# Patient Record
Sex: Male | Born: 1965 | Race: White | Hispanic: No | Marital: Married | Smoking: Current every day smoker
Health system: Southern US, Community
[De-identification: ages and names within clinical notes are randomized; demographics above are authoritative.]

## PROBLEM LIST (undated history)

## (undated) DIAGNOSIS — M25519 Pain in unspecified shoulder: Secondary | ICD-10-CM

## (undated) DIAGNOSIS — R0600 Dyspnea, unspecified: Secondary | ICD-10-CM

## (undated) DIAGNOSIS — M1990 Unspecified osteoarthritis, unspecified site: Secondary | ICD-10-CM

## (undated) DIAGNOSIS — J449 Chronic obstructive pulmonary disease, unspecified: Secondary | ICD-10-CM

## (undated) DIAGNOSIS — K219 Gastro-esophageal reflux disease without esophagitis: Secondary | ICD-10-CM

## (undated) DIAGNOSIS — I10 Essential (primary) hypertension: Secondary | ICD-10-CM

## (undated) DIAGNOSIS — M542 Cervicalgia: Secondary | ICD-10-CM

## (undated) HISTORY — DX: Chronic obstructive pulmonary disease, unspecified: J44.9

## (undated) HISTORY — PX: NO PAST SURGERIES: SHX2092

---

## 2017-12-06 ENCOUNTER — Encounter: Payer: Self-pay | Admitting: *Deleted

## 2017-12-25 DIAGNOSIS — F17200 Nicotine dependence, unspecified, uncomplicated: Secondary | ICD-10-CM | POA: Diagnosis not present

## 2017-12-25 DIAGNOSIS — Z6830 Body mass index (BMI) 30.0-30.9, adult: Secondary | ICD-10-CM | POA: Diagnosis not present

## 2017-12-25 DIAGNOSIS — J449 Chronic obstructive pulmonary disease, unspecified: Secondary | ICD-10-CM | POA: Diagnosis not present

## 2017-12-25 DIAGNOSIS — L989 Disorder of the skin and subcutaneous tissue, unspecified: Secondary | ICD-10-CM | POA: Diagnosis not present

## 2018-01-01 DIAGNOSIS — Z0000 Encounter for general adult medical examination without abnormal findings: Secondary | ICD-10-CM | POA: Diagnosis not present

## 2018-01-10 DIAGNOSIS — L821 Other seborrheic keratosis: Secondary | ICD-10-CM | POA: Diagnosis not present

## 2018-01-10 DIAGNOSIS — D0439 Carcinoma in situ of skin of other parts of face: Secondary | ICD-10-CM | POA: Diagnosis not present

## 2018-01-10 DIAGNOSIS — C44329 Squamous cell carcinoma of skin of other parts of face: Secondary | ICD-10-CM | POA: Diagnosis not present

## 2018-01-10 DIAGNOSIS — L570 Actinic keratosis: Secondary | ICD-10-CM | POA: Diagnosis not present

## 2018-01-15 DIAGNOSIS — J449 Chronic obstructive pulmonary disease, unspecified: Secondary | ICD-10-CM | POA: Diagnosis not present

## 2018-01-15 DIAGNOSIS — L989 Disorder of the skin and subcutaneous tissue, unspecified: Secondary | ICD-10-CM | POA: Diagnosis not present

## 2018-01-15 DIAGNOSIS — F17200 Nicotine dependence, unspecified, uncomplicated: Secondary | ICD-10-CM | POA: Diagnosis not present

## 2018-01-15 DIAGNOSIS — Z0000 Encounter for general adult medical examination without abnormal findings: Secondary | ICD-10-CM | POA: Diagnosis not present

## 2018-01-16 ENCOUNTER — Other Ambulatory Visit: Payer: Self-pay | Admitting: Internal Medicine

## 2018-01-16 DIAGNOSIS — F17200 Nicotine dependence, unspecified, uncomplicated: Secondary | ICD-10-CM

## 2018-01-23 ENCOUNTER — Ambulatory Visit (HOSPITAL_COMMUNITY)
Admission: RE | Admit: 2018-01-23 | Discharge: 2018-01-23 | Disposition: A | Discharge status: Home / Self Care | Payer: BLUE CROSS/BLUE SHIELD | Source: Ambulatory Visit | Attending: Internal Medicine | Admitting: Internal Medicine

## 2018-01-23 ENCOUNTER — Other Ambulatory Visit: Payer: Self-pay | Admitting: Internal Medicine

## 2018-01-23 DIAGNOSIS — F17200 Nicotine dependence, unspecified, uncomplicated: Secondary | ICD-10-CM

## 2018-01-23 DIAGNOSIS — Z136 Encounter for screening for cardiovascular disorders: Secondary | ICD-10-CM | POA: Diagnosis not present

## 2018-01-23 DIAGNOSIS — Z87891 Personal history of nicotine dependence: Secondary | ICD-10-CM | POA: Diagnosis not present

## 2018-02-14 DIAGNOSIS — L570 Actinic keratosis: Secondary | ICD-10-CM | POA: Diagnosis not present

## 2018-02-25 ENCOUNTER — Ambulatory Visit: Payer: BLUE CROSS/BLUE SHIELD

## 2018-05-09 DIAGNOSIS — Z6830 Body mass index (BMI) 30.0-30.9, adult: Secondary | ICD-10-CM | POA: Diagnosis not present

## 2018-05-09 DIAGNOSIS — L989 Disorder of the skin and subcutaneous tissue, unspecified: Secondary | ICD-10-CM | POA: Diagnosis not present

## 2018-05-09 DIAGNOSIS — J449 Chronic obstructive pulmonary disease, unspecified: Secondary | ICD-10-CM | POA: Diagnosis not present

## 2018-05-09 DIAGNOSIS — F17200 Nicotine dependence, unspecified, uncomplicated: Secondary | ICD-10-CM | POA: Diagnosis not present

## 2018-05-14 DIAGNOSIS — F17200 Nicotine dependence, unspecified, uncomplicated: Secondary | ICD-10-CM | POA: Diagnosis not present

## 2018-05-14 DIAGNOSIS — J441 Chronic obstructive pulmonary disease with (acute) exacerbation: Secondary | ICD-10-CM | POA: Diagnosis not present

## 2019-09-25 ENCOUNTER — Other Ambulatory Visit: Payer: Self-pay | Admitting: Orthopedic Surgery

## 2019-09-25 DIAGNOSIS — M25511 Pain in right shoulder: Secondary | ICD-10-CM

## 2019-09-25 DIAGNOSIS — M542 Cervicalgia: Secondary | ICD-10-CM

## 2019-10-25 ENCOUNTER — Other Ambulatory Visit: Payer: BLUE CROSS/BLUE SHIELD

## 2019-11-19 ENCOUNTER — Ambulatory Visit
Admission: RE | Admit: 2019-11-19 | Discharge: 2019-11-19 | Disposition: A | Discharge status: Home / Self Care | Payer: 59 | Source: Ambulatory Visit | Attending: Orthopedic Surgery | Admitting: Orthopedic Surgery

## 2019-11-19 ENCOUNTER — Other Ambulatory Visit: Payer: Self-pay

## 2019-11-19 DIAGNOSIS — M25511 Pain in right shoulder: Secondary | ICD-10-CM

## 2019-11-19 DIAGNOSIS — M542 Cervicalgia: Secondary | ICD-10-CM

## 2019-12-18 ENCOUNTER — Other Ambulatory Visit: Payer: 59

## 2019-12-22 ENCOUNTER — Other Ambulatory Visit: Payer: 59

## 2020-05-18 ENCOUNTER — Encounter: Payer: Self-pay | Admitting: Emergency Medicine

## 2020-05-18 ENCOUNTER — Ambulatory Visit (INDEPENDENT_AMBULATORY_CARE_PROVIDER_SITE_OTHER): Payer: 59

## 2020-05-18 ENCOUNTER — Ambulatory Visit
Admission: EM | Admit: 2020-05-18 | Discharge: 2020-05-18 | Disposition: A | Discharge status: Home / Self Care | Payer: 59 | Attending: Emergency Medicine | Admitting: Emergency Medicine

## 2020-05-18 DIAGNOSIS — M25572 Pain in left ankle and joints of left foot: Secondary | ICD-10-CM | POA: Diagnosis not present

## 2020-05-18 DIAGNOSIS — M79672 Pain in left foot: Secondary | ICD-10-CM

## 2020-05-18 HISTORY — DX: Unspecified osteoarthritis, unspecified site: M19.90

## 2020-05-18 HISTORY — DX: Essential (primary) hypertension: I10

## 2020-05-18 HISTORY — DX: Cervicalgia: M54.2

## 2020-05-18 HISTORY — DX: Pain in unspecified shoulder: M25.519

## 2020-05-18 NOTE — ED Triage Notes (Signed)
Pain to top of LT foot and LT ankle since yesterday when he stepped in a hole and twisted his ankle.  Pt is ambulatory and wearing an orthopedic shoe.

## 2020-05-18 NOTE — Discharge Instructions (Addendum)
Take OTC Tylenol/ibuprofen as needed for moderate pain Continue to take the Vicodin as needed for severe pain Follow RICE instruction that is attached Follow-up with PCP Return or go to ED if you develop any new or worsening of symptoms

## 2020-05-18 NOTE — ED Provider Notes (Signed)
[facility]   [id number] 05/18/20 Arrival Time: 0813   Chief Complaint  Patient presents with  . Foot Injury     SUBJECTIVE: History from: patient and family.  Julian Perez is a 54 y.o. male who presented to the urgent care with a complaint of left foot/ankle pain that started yesterday.  Foot of the injury while stepping in a hole and twisting his ankle.  He localizes the pain to the left foot/ankle.  He describes the pain as constant and achy.  He has tried OTC medications without relief.  His symptoms are made worse with ROM.  He denies similar symptoms in the past.  Denies chills, fever, nausea, vomiting, diarrhea.   ROS: As per HPI.  All other pertinent ROS negative.      Past Medical History:  Diagnosis Date  . Arthritis   . COPD (chronic obstructive pulmonary disease) ([place])   . Hypertension   . Neck pain   . Shoulder pain    History reviewed. No pertinent surgical history. Allergies  Allergen Reactions  . Penicillins Other (See Comments)    DIZZINESS    No current facility-administered medications on file prior to encounter.   No current outpatient medications on file prior to encounter.   Social History   Socioeconomic History  . Marital status: Married    Spouse name: Not on file  . Number of children: Not on file  . Years of education: Not on file  . Highest education level: Not on file  Occupational History  . Not on file  Tobacco Use  . Smoking status: Current Every Day Smoker    Packs/day: 1.50    Types: Cigarettes  . Smokeless tobacco: Never Used  Vaping Use  . Vaping Use: Never used  Substance and Sexual Activity  . Alcohol use: Never  . Drug use: Never  . Sexual activity: Not on file  Other Topics Concern  . Not on file  Social History Narrative  . Not on file   Social Determinants of Health   Financial Resource Strain:   . Difficulty of Paying Living Expenses: Not on file  Food Insecurity:   . Worried About Paediatric nurse in the Last Year: Not on file  . Ran Out of Food in the Last Year: Not on file  Transportation Needs:   . Lack of Transportation (Medical): Not on file  . Lack of Transportation (Non-Medical): Not on file  Physical Activity:   . Days of Exercise per Week: Not on file  . Minutes of Exercise per Session: Not on file  Stress:   . Feeling of Stress : Not on file  Social Connections:   . Frequency of Communication with Friends and Family: Not on file  . Frequency of Social Gatherings with Friends and Family: Not on file  . Attends Religious Services: Not on file  . Active Member of Clubs or Organizations: Not on file  . Attends Archivist Meetings: Not on file  . Marital Status: Not on file  Intimate Partner Violence:   . Fear of Current or Ex-Partner: Not on file  . Emotionally Abused: Not on file  . Physically Abused: Not on file  . Sexually Abused: Not on file   No family history on file.  OBJECTIVE:  Vitals:   05/18/20 0825 05/18/20 0826  BP: 132/75   Pulse: 80   Resp: 19   Temp: 98 F (36.7 C)   TempSrc: Oral   SpO2: 95%  Weight:  170 lb (77.1 kg)  Height:  5\' 4"  (1.626 m)     Physical Exam Vitals and nursing note reviewed.  Constitutional:      General: He is not in acute distress.    Appearance: Normal appearance. He is normal weight. He is not ill-appearing, toxic-appearing or diaphoretic.  Cardiovascular:     Rate and Rhythm: Normal rate and regular rhythm.     Pulses: Normal pulses.     Heart sounds: Normal heart sounds. No murmur heard.  No friction rub. No gallop.   Pulmonary:     Effort: Pulmonary effort is normal. No respiratory distress.     Breath sounds: Normal breath sounds. No stridor. No wheezing, rhonchi or rales.  Chest:     Chest wall: No tenderness.  Musculoskeletal:        General: Tenderness present.     Right ankle: Normal.     Left ankle: Tenderness present.     Right foot: Normal.     Left foot: Tenderness  present.     Comments: Patient is able to ambulate and bear weight with pain.  The left foot/ankle is with obvious  deformity when compared to the right foot/ankle.  Swelling is present there is no ecchymosis, open wound, lesion, warmth present.  Limited range of motion due to pain.  Neurovascular status intact.  Neurological:     Mental Status: He is alert and oriented to person, place, and time.     LABS:  No results found for this or any previous visit (from the past 24 hour(s)).   RADIOLOGY:  DG Ankle Complete Left  Result Date: 05/18/2020 CLINICAL DATA:  Ankle injury.  Pain EXAM: LEFT ANKLE COMPLETE - 3+ VIEW COMPARISON:  None. FINDINGS: There is no evidence of fracture, dislocation, or joint effusion. There is no evidence of arthropathy or other focal bone abnormality. Soft tissues are unremarkable. IMPRESSION: Negative. Electronically Signed   By: Franchot Gallo M.D.   On: 05/18/2020 08:47   DG Foot Complete Left  Result Date: 05/18/2020 CLINICAL DATA:  Ankle injury.  Pain EXAM: LEFT FOOT - COMPLETE 3+ VIEW COMPARISON:  None. FINDINGS: Deformity fifth metatarsal compatible with chronic healed fracture. Negative for acute fracture.  No significant arthropathy. IMPRESSION: Negative for acute fracture. Electronically Signed   By: Franchot Gallo M.D.   On: 05/18/2020 08:46     The left foot/ankle x-ray is negative for bony abnormality including fracture or dislocation.  I have reviewed the x-ray myself and the radiologist interpretation.  I am in agreement with the radiologist interpretation.   ASSESSMENT & PLAN:  1. Left foot pain   2. Acute left ankle pain     No orders of the defined types were placed in this encounter.  Discharge instructions  Take OTC Tylenol/ibuprofen as needed for moderate pain Continue to take the Vicodin as needed for severe pain Follow RICE instruction that is attached Follow-up with PCP Return or go to ED if you develop any new or worsening of  symptoms  Reviewed expectations re: course of current medical issues. Questions answered. Outlined signs and symptoms indicating need for more acute intervention. Patient verbalized understanding. After Visit Summary given.     PDMP reviewed during this encounter.     Emerson Monte, FNP 05/18/20 0900

## 2020-12-07 DIAGNOSIS — G894 Chronic pain syndrome: Secondary | ICD-10-CM | POA: Insufficient documentation

## 2021-02-21 DIAGNOSIS — M542 Cervicalgia: Secondary | ICD-10-CM | POA: Insufficient documentation

## 2021-02-21 DIAGNOSIS — G4700 Insomnia, unspecified: Secondary | ICD-10-CM | POA: Insufficient documentation

## 2021-02-21 DIAGNOSIS — E782 Mixed hyperlipidemia: Secondary | ICD-10-CM | POA: Insufficient documentation

## 2021-02-21 DIAGNOSIS — K219 Gastro-esophageal reflux disease without esophagitis: Secondary | ICD-10-CM | POA: Insufficient documentation

## 2021-02-21 DIAGNOSIS — M1990 Unspecified osteoarthritis, unspecified site: Secondary | ICD-10-CM | POA: Insufficient documentation

## 2021-02-21 DIAGNOSIS — R7303 Prediabetes: Secondary | ICD-10-CM | POA: Insufficient documentation

## 2021-05-20 ENCOUNTER — Other Ambulatory Visit (HOSPITAL_COMMUNITY): Payer: Self-pay | Admitting: Neurology

## 2021-05-20 ENCOUNTER — Other Ambulatory Visit: Payer: Self-pay | Admitting: Neurology

## 2021-05-20 DIAGNOSIS — M5412 Radiculopathy, cervical region: Secondary | ICD-10-CM

## 2021-05-23 ENCOUNTER — Other Ambulatory Visit: Payer: Self-pay | Admitting: Neurology

## 2021-05-23 DIAGNOSIS — M5412 Radiculopathy, cervical region: Secondary | ICD-10-CM

## 2021-06-02 ENCOUNTER — Other Ambulatory Visit (HOSPITAL_COMMUNITY): Payer: Self-pay | Admitting: Neurology

## 2021-06-02 DIAGNOSIS — M5412 Radiculopathy, cervical region: Secondary | ICD-10-CM

## 2021-06-08 ENCOUNTER — Ambulatory Visit (HOSPITAL_COMMUNITY)
Admission: RE | Admit: 2021-06-08 | Discharge: 2021-06-08 | Disposition: A | Discharge status: Home / Self Care | Payer: 59 | Source: Ambulatory Visit | Attending: Neurology | Admitting: Neurology

## 2021-06-08 ENCOUNTER — Other Ambulatory Visit: Payer: Self-pay

## 2021-06-08 DIAGNOSIS — M5412 Radiculopathy, cervical region: Secondary | ICD-10-CM | POA: Insufficient documentation

## 2021-08-23 DIAGNOSIS — E559 Vitamin D deficiency, unspecified: Secondary | ICD-10-CM | POA: Insufficient documentation

## 2021-08-26 ENCOUNTER — Other Ambulatory Visit: Payer: Self-pay

## 2021-08-26 ENCOUNTER — Ambulatory Visit (HOSPITAL_COMMUNITY)
Admission: RE | Admit: 2021-08-26 | Discharge: 2021-08-26 | Disposition: A | Discharge status: Home / Self Care | Payer: Self-pay | Source: Ambulatory Visit | Attending: Family Medicine | Admitting: Family Medicine

## 2021-08-26 ENCOUNTER — Other Ambulatory Visit (HOSPITAL_COMMUNITY): Payer: Self-pay | Admitting: Family Medicine

## 2021-08-26 ENCOUNTER — Other Ambulatory Visit: Payer: Self-pay | Admitting: Family Medicine

## 2021-08-26 DIAGNOSIS — R053 Chronic cough: Secondary | ICD-10-CM

## 2021-08-26 DIAGNOSIS — R1310 Dysphagia, unspecified: Secondary | ICD-10-CM

## 2021-08-26 DIAGNOSIS — L989 Disorder of the skin and subcutaneous tissue, unspecified: Secondary | ICD-10-CM | POA: Insufficient documentation

## 2021-08-26 DIAGNOSIS — R42 Dizziness and giddiness: Secondary | ICD-10-CM

## 2021-09-02 ENCOUNTER — Other Ambulatory Visit: Payer: Self-pay

## 2021-09-02 ENCOUNTER — Ambulatory Visit (HOSPITAL_COMMUNITY)
Admission: RE | Admit: 2021-09-02 | Discharge: 2021-09-02 | Disposition: A | Discharge status: Home / Self Care | Payer: 59 | Source: Ambulatory Visit | Attending: Family Medicine | Admitting: Family Medicine

## 2021-09-02 ENCOUNTER — Other Ambulatory Visit (HOSPITAL_COMMUNITY): Payer: Self-pay | Admitting: Family Medicine

## 2021-09-02 DIAGNOSIS — R42 Dizziness and giddiness: Secondary | ICD-10-CM

## 2021-09-02 DIAGNOSIS — I10 Essential (primary) hypertension: Secondary | ICD-10-CM | POA: Diagnosis not present

## 2021-09-02 DIAGNOSIS — R55 Syncope and collapse: Secondary | ICD-10-CM | POA: Insufficient documentation

## 2021-09-02 DIAGNOSIS — R1310 Dysphagia, unspecified: Secondary | ICD-10-CM | POA: Diagnosis present

## 2021-09-06 ENCOUNTER — Other Ambulatory Visit (HOSPITAL_COMMUNITY): Payer: Self-pay | Admitting: Specialist

## 2021-09-06 DIAGNOSIS — R1319 Other dysphagia: Secondary | ICD-10-CM

## 2021-09-15 ENCOUNTER — Other Ambulatory Visit: Payer: Self-pay

## 2021-09-15 ENCOUNTER — Ambulatory Visit (INDEPENDENT_AMBULATORY_CARE_PROVIDER_SITE_OTHER): Payer: 59

## 2021-09-15 ENCOUNTER — Ambulatory Visit (INDEPENDENT_AMBULATORY_CARE_PROVIDER_SITE_OTHER): Payer: 59 | Admitting: Orthopaedic Surgery

## 2021-09-15 DIAGNOSIS — M542 Cervicalgia: Secondary | ICD-10-CM

## 2021-09-15 DIAGNOSIS — M4802 Spinal stenosis, cervical region: Secondary | ICD-10-CM | POA: Diagnosis not present

## 2021-09-20 ENCOUNTER — Ambulatory Visit (HOSPITAL_COMMUNITY)
Admission: RE | Admit: 2021-09-20 | Discharge: 2021-09-20 | Disposition: A | Discharge status: Home / Self Care | Payer: 59 | Source: Ambulatory Visit | Attending: Family Medicine | Admitting: Family Medicine

## 2021-09-20 ENCOUNTER — Encounter (HOSPITAL_COMMUNITY): Payer: Self-pay | Admitting: Speech Pathology

## 2021-09-20 ENCOUNTER — Ambulatory Visit (HOSPITAL_COMMUNITY): Payer: 59 | Attending: Family Medicine | Admitting: Speech Pathology

## 2021-09-20 ENCOUNTER — Other Ambulatory Visit: Payer: Self-pay

## 2021-09-20 DIAGNOSIS — R1312 Dysphagia, oropharyngeal phase: Secondary | ICD-10-CM | POA: Diagnosis present

## 2021-09-20 DIAGNOSIS — R1319 Other dysphagia: Secondary | ICD-10-CM | POA: Insufficient documentation

## 2021-09-20 NOTE — Therapy (Signed)
[name] ?[address] ?[place], [place], [address] ?Phone: [phone]   Fax:  [phone] ? ?Modified Barium Swallow ? ?Patient Details  ?Name: Julian Perez ?MRN: [id number] ?Date of Birth: 03/12/1966 ?No data recorded ? ?Encounter Date: 09/20/2021 ? ? End of Session - 09/20/21 1712   ? ? Visit Number 1   ? Number of Visits 1   ? Authorization Type [employer]   ? SLP Start Time 1302   ? SLP Stop Time  1332   ? SLP Time Calculation (min) 30 min   ? Activity Tolerance Patient tolerated treatment well   ? ?  ?  ? ?  ? ? ?Past Medical History:  ?Diagnosis Date  ? Arthritis   ? COPD (chronic obstructive pulmonary disease) ([place])   ? Hypertension   ? Neck pain   ? Shoulder pain   ? ? ?History reviewed. No pertinent surgical history. ? ?There were no vitals filed for this visit. ? ? Subjective Assessment - 09/20/21 1707   ? ? Subjective "This has been going on a couple of years." (difficulty swallowing)   ? Special Tests MBSS   ? Currently in Pain? No/denies   ? ?  ?  ? ?  ? ? ? ? ? ? General - 09/20/21 1707   ? ?  ? General Information  ? Date of Onset 09/06/21   ? HPI Julian Perez is a 56 yo male who was referred by Valentino Nose, FNP for MBSS due to Pt reports of liquids occasionally going back up his nose, solids not going down, and coughing on liquids. He indicates that this has been going on for a couple of years. He has an upper plate and minimal lower dentition. He says he is having surgery next month on his neck.   ? Type of Study MBS-Modified Barium Swallow Study   ? Diet Prior to this Study Regular;Thin liquids   ? Temperature Spikes Noted No   ? Respiratory Status Room air   ? History of Recent Intubation No   ? Behavior/Cognition Alert;Cooperative;Pleasant mood   ? Oral Cavity Assessment Within Functional Limits   ? Oral Care Completed by SLP No   ? Oral Cavity - Dentition Dentures, top;Missing dentition   ? Vision Functional for self feeding   ? Self-Feeding  Abilities Able to feed self   ? Patient Positioning Upright in chair   ? Baseline Vocal Quality Normal   ? Volitional Cough Strong   ? Volitional Swallow Able to elicit   ? Anatomy Within functional limits   ? Pharyngeal Secretions Not observed secondary MBS   ? ?  ?  ? ?  ? ? ? ? ? Oral Preparation/Oral Phase - 09/20/21 1710   ? ?  ? Oral Preparation/Oral Phase  ? Oral Phase Impaired   ?  ? Oral - Thin  ? Oral - Thin Teaspoon Within functional limits   ? Oral - Thin Cup Within functional limits   ? Oral - Thin Straw Within functional limits   ?  ? Oral - Solids  ? Oral - Puree Within functional limits   ? Oral - Regular Oral residue;Piecemeal swallowing   ? Oral - Pill Within functional limits   ?  ? Electrical stimulation - Oral Phase  ? Was Electrical Stimulation Used No   ? ?  ?  ? ?  ? ? ? Pharyngeal Phase - 09/20/21 1711   ? ?  ?  Pharyngeal Phase  ? Pharyngeal Phase Impaired   ?  ? Pharyngeal - Thin  ? Pharyngeal- Thin Teaspoon Swallow initiation at vallecula   ? Pharyngeal- Thin Cup Swallow initiation at vallecula;Penetration/Aspiration during swallow   ? Pharyngeal Material enters airway, remains ABOVE vocal cords then ejected out   ? Pharyngeal- Thin Straw Swallow initiation at vallecula;Penetration/Aspiration during swallow   ? Pharyngeal Material enters airway, remains ABOVE vocal cords then ejected out   ?  ? Pharyngeal - Solids  ? Pharyngeal- Puree Swallow initiation at vallecula   ? Pharyngeal- Regular Within functional limits   ? Pharyngeal- Pill Within functional limits   ? ?  ?  ? ?  ? ? ? Cricopharyngeal Phase - 09/20/21 1711   ? ?  ? Cervical Esophageal Phase  ? Cervical Esophageal Phase Within functional limits   ? ?  ?  ? ?  ? ? ? ? Plan - 09/20/21 1542   ? ? Clinical Impression Statement Pt presents with min oral phase dysphagia in setting of limited lower dentition and upper plate which resulted in mildly prolonged oral transit of regular textures and secondary swallow to clear min oral  residuals. Swallow trigger was at the level of the valleculae across consistencies and textures, hyolaryngeal excursion and tongue base retraction are WFL, flash trace penetration during the swallow with thins without aspiration and was consistently removed. Pt reported that swallowing felt effortful when he was swallowing the min residuals of regular textures. Overall oropharyngeal swallow was WNL and esophageal sweep was unremarkable. Pt was encouraged to keep a swallowing journal to record when he has trouble swallowing (when it occurs, what it occurs with, etc) due to variance in his symptoms (comes out nose, chokes on liquids, solids won't go down) over the past "couple of years". He reports that he has surgery next month on his "neck". He was given my contact information should he have further questions of concerns, and information on esophageal swallow precautions and COPD/swallow precautions. No further SLP services indicated at this time.   ? ?  ?  ? ?  ? ? ?Patient will benefit from skilled therapeutic intervention in order to improve the following deficits and impairments:   ?Dysphagia, oropharyngeal phase ? ? ? ? Recommendations/Treatment - 09/20/21 1711   ? ?  ? Swallow Evaluation Recommendations  ? Recommended Consults Consider GI evaluation   ? SLP Diet Recommendations Thin;Age appropriate regular   ? Liquid Administration via Cup;Straw   ? Medication Administration Whole meds with liquid   ? Supervision Patient able to self feed   ? Postural Changes Seated upright at 90 degrees;Remain upright for at least 30 minutes after feeds/meals   ? ?  ?  ? ?  ? ? ? Prognosis - 09/20/21 1712   ? ?  ? Prognosis  ? Prognosis for Safe Diet Advancement Good   ?  ? Individuals Consulted  ? Consulted and Agree with Results and Recommendations Patient   ? Report Sent to  Referring physician   ? ?  ?  ? ?  ? ? ?Problem List ?There are no problems to display for this patient. ? ?Thank you, ? ?Genene Churn,  [place] ?[phone] ? ?Larua Collier, CCC-SLP ?09/21/2021, 11:43 AM ? ?[facility] ?[address] ?[place], [place], [address] ?Phone: [phone]   Fax:  [phone] ? ?Name: Julian Perez ?MRN: [id number] ?Date of Birth: 1965/07/24 ? ?

## 2021-09-21 DIAGNOSIS — M4802 Spinal stenosis, cervical region: Secondary | ICD-10-CM | POA: Insufficient documentation

## 2021-09-21 NOTE — Progress Notes (Signed)
? ?Office Visit Note ?  ?Patient: Julian Perez           ?Date of Birth: 09/20/1965           ?MRN: [id number] ?Visit Date: 09/15/2021 ?             ?Requested by: Celene Squibb, MD ?[address] ?[address] ?[place],  [place] [address] ?PCP: Celene Squibb, MD ? ? ?Assessment & Plan: ?Visit Diagnoses:  ?1. Neck pain   ?2. Spinal stenosis of cervical region   ? ? ?Plan: We reviewed MRI scan discussed options for two-level cervical fusion.  We discussed he be on the same pain medicine after surgery no change in dosage.  He does not have any cord abnormal signal on his scan in December and has the potential for improvement in his gait and upper extremity strength.  Planned procedure discussed with two-level cervical fusion C5-6 C6-7.  Use of allograft and plate.  Questions were elicited and answered.  Overnight stay discussed. ? ?Follow-Up Instructions: No follow-ups on file.  ? ?Orders:  ?Orders Placed This Encounter  ?Procedures  ? XR Cervical Spine 2 or 3 views  ? ?No orders of the defined types were placed in this encounter. ? ? ? ? Procedures: ?No procedures performed ? ? ?Clinical Data: ?No additional findings. ? ? ?Subjective: ?Chief Complaint  ?Patient presents with  ? Neck - Pain  ? ? ?HPI 56 year old male referred by Dr. Merlene Laughter with several years history of severe neck pain.  Has radicular pain that shoots into his arms he has numbness and tingling in both hands and states he drops objects.  Patient is a smoker states he has had some balance issues and has been on Neurontin.  Occasionally pain in his leg but states his neck is much worse.  His neurologist is Dr. Florene Glen.  He states he has not worked in the last 7 months.  Patient's had cervical epidurals in the past done through [facility]. ? ?Patient had cervical MRI scan [facility] 06/08/2021.  This shows moderate to severe spinal stenosis at C5-6 with severe right and moderate left neuroforaminal stenosis.  There is paracentral disc protrusion.  C6-7 there  is moderate spinal stenosis with severe left and moderate right neuroforaminal stenosis.  Patient did have some moderate foraminal narrowing at C3-4 and C4-5 without severe compression. ? ?Patient's had problems with balance and gait disturbance. ? ?Patient been placed on oxycodone currently on 10/325 takes at 110 tablets monthly back in 2021 he used to be on hydrocodone 10/325 90 tablets monthly. ? ?Past history of anxiety asthma depression hypertension. ? ?Review of Systems chronic neck pain greater than 10 years. ? ? ?Objective: ?Vital Signs: There were no vitals taken for this visit. ? ?Physical Exam ?Constitutional:   ?   Appearance: He is well-developed.  ?HENT:  ?   Head: Normocephalic and atraumatic.  ?   Right Ear: External ear normal.  ?   Left Ear: External ear normal.  ?Eyes:  ?   Pupils: Pupils are equal, round, and reactive to light.  ?Neck:  ?   Thyroid: No thyromegaly.  ?   Trachea: No tracheal deviation.  ?Cardiovascular:  ?   Rate and Rhythm: Normal rate.  ?Pulmonary:  ?   Effort: Pulmonary effort is normal.  ?   Breath sounds: No wheezing.  ?Abdominal:  ?   General: Bowel sounds are normal.  ?   Palpations: Abdomen is soft.  ?Musculoskeletal:  ?  Cervical back: Neck supple.  ?Skin: ?   General: Skin is warm and dry.  ?   Capillary Refill: Capillary refill takes less than 2 seconds.  ?Neurological:  ?   Mental Status: He is alert and oriented to person, place, and time.  ?Psychiatric:     ?   Behavior: Behavior normal.     ?   Thought Content: Thought content normal.     ?   Judgment: Judgment normal.  ? ? ?Ortho Exam reflexes are 1+ and symmetrical.  Negative impingement right left shoulder.  Positive brachial plexus tenderness both right and left.  No lower extremity clonus there is 3+ symmetrical knee and ankle jerk.  Hyperreflexia.  He does have some decreased balance short stride gait.  Gait is not classic myelopathic. ? ?Patient has some decreased wrist flexion extension biceps triceps  diffuse right and left and symmetrical. ? ?Specialty Comments:  ?No specialty comments available. ? ?Imaging: ?Narrative & Impression  ?CLINICAL DATA:  Neck pain radiates to the head and down the back ?  ?EXAM: ?MRI CERVICAL SPINE WITHOUT CONTRAST ?  ?TECHNIQUE: ?Multiplanar, multisequence MR imaging of the cervical spine was ?performed. No intravenous contrast was administered. ?  ?COMPARISON:  None. ?  ?FINDINGS: ?Alignment: Trace retrolisthesis C5 on C6 and C6 on C7. ?  ?Vertebrae: No acute fracture or suspicious osseous lesions. Endplate ?degenerative changes at the anterior aspects of C5 and C6. ?  ?Cord: Normal spinal cord signal. There is mild cord deformation at ?C5-C6, secondary to a disc protrusion, described below. The spinal ?cord is otherwise normal in morphology. ?  ?Posterior Fossa, vertebral arteries, paraspinal tissues: Negative. ?  ?Disc levels: ?  ?C2-mild C3: No significant disc bulge. Facet arthropathy. No spinal ?canal stenosis or neuroforaminal narrowing. ?  ?C3-C4: No significant disc bulge. Facet and uncovertebral ?hypertrophy. No spinal canal stenosis. Moderate right and mild left ?neural foraminal narrowing. ?  ?C4-C5: No significant disc bulge. Facet and uncovertebral ?hypertrophy. No spinal canal stenosis. Moderate ?right-greater-than-left neural foraminal narrowing. ?  ?C5-C6: Right eccentric disc bulge with superimposed right ?subarticular and paracentral disc protrusion, which indents and ?deforms the ventral spinal cord. Moderate to severe spinal canal ?stenosis. Uncovertebral facet arthropathy. Severe right and moderate ?left neural foraminal narrowing. ?  ?C6-C7: Disc bulge with superimposed right foraminal disc protrusion. ?Moderate spinal canal stenosis. Uncovertebral and facet arthropathy. ?Severe left and moderate right neural foraminal narrowing. ?  ?C7-T1: No significant disc bulge. No spinal canal stenosis or ?neuroforaminal narrowing. ?  ?IMPRESSION: ?1. C5-C6 moderate to  severe spinal canal stenosis, with severe right ?and moderate left neural foraminal narrowing. A right subarticular ?and paracentral disc protrusion indents and causes mild deformation ?of the spinal cord at this level, without cord signal abnormality. ?2. C6-C7 moderate spinal canal stenosis with severe left and ?moderate right neural foraminal narrowing. ?3. Additional moderate neural foraminal narrowing on the right at ?C3-C4 and bilaterally at C4-C5. ?  ?  ?Electronically Signed ?  By: Merilyn Baba M.D. ?  On: 06/08/2021 11:24  ? ? ? ?PMFS History: ?Patient Active Problem List  ? Diagnosis Date Noted  ? Spinal stenosis of cervical region 09/21/2021  ? ?Past Medical History:  ?Diagnosis Date  ? Arthritis   ? COPD (chronic obstructive pulmonary disease) ([place])   ? Hypertension   ? Neck pain   ? Shoulder pain   ?  ?No family history on file.  ?No past surgical history on file. ?Social History  ? ?Occupational History  ?  Not on file  ?Tobacco Use  ? Smoking status: Every Day  ?  Packs/day: 1.50  ?  Types: Cigarettes  ? Smokeless tobacco: Never  ?Vaping Use  ? Vaping Use: Never used  ?Substance and Sexual Activity  ? Alcohol use: Never  ? Drug use: Never  ? Sexual activity: Not on file  ? ? ? ? ? ? ?

## 2021-09-27 ENCOUNTER — Other Ambulatory Visit: Payer: Self-pay

## 2021-10-03 NOTE — Pre-Procedure Instructions (Signed)
Surgical Instructions ? ? ? Your procedure is scheduled on Monday, April 10th. ? Report to [facility] Main Entrance "A" at 10:30 A.M., then check in with the Admitting office. ? Call this number if you have problems the morning of surgery: ? [phone] ? ? If you have any questions prior to your surgery date call [phone]: Open Monday-Friday 8am-4pm ? ? ? Remember: ? Do not eat after midnight the night before your surgery ? ?You may drink clear liquids until 09:30 AM the morning of your surgery.   ?Clear liquids allowed are: Water, Non-Citrus Juices (without pulp), Carbonated Beverages, Clear Tea, Black Coffee Only (NO MILK, CREAM OR POWDERED CREAMER of any kind), and Gatorade. ? ? ?Patient Instructions ? ?The night before surgery:  ?No food after midnight. ONLY clear liquids after midnight ? ?The day of surgery (if you do NOT have diabetes):  ?Drink ONE (1) Pre-Surgery Clear Ensure by 09:30 AM the morning of surgery. Drink in one sitting. Do not sip.  ?This drink was given to you during your hospital  ?pre-op appointment visit. ? ?Nothing else to drink after completing the  ?Pre-Surgery Clear Ensure. ? ? ?       If you have questions, please contact your surgeon?s office.  ? ?  ? Take these medicines the morning of surgery with A SIP OF WATER  ?DULoxetine (CYMBALTA) ?gabapentin (NEURONTIN)  ?methocarbamol (ROBAXIN)  ?omeprazole (PRILOSEC) ?TRELEGY ELLIPTA ? ?If needed: ?albuterol (PROVENTIL) (2.5 MG/3ML) 0.083% nebulizer solution ?albuterol (VENTOLIN HFA)- if needed, bring inhaler with you on day of surgery ?oxyCODONE-acetaminophen (PERCOCET) ? ? ?As of today, STOP taking any Aspirin (unless otherwise instructed by your surgeon) Aleve, Naproxen, Ibuprofen, Motrin, Advil, Goody's, BC's, all herbal medications, fish oil, and all vitamins. ?         ?           ?Do NOT Smoke (Tobacco/Vaping) for 24 hours prior to your procedure. ? ?If you use a CPAP at night, you may bring your mask/headgear for your overnight  stay. ?  ?Contacts, glasses, piercing's, hearing aid's, dentures or partials may not be worn into surgery, please bring cases for these belongings.  ?  ?For patients admitted to the hospital, discharge time will be determined by your treatment team. ?  ?Patients discharged the day of surgery will not be allowed to drive home, and someone needs to stay with them for 24 hours. ? ?SURGICAL WAITING ROOM VISITATION ?Patients having surgery or a procedure may have two support people in the waiting room. These visitors may be switched out with other visitors if needed. ?Children under the age of 85 must have an adult accompany them who is not the patient. ?If the patient needs to stay at the hospital during part of their recovery, the visitor guidelines for inpatient rooms apply. ? ?Please refer to the [facility] website for the visitor guidelines for Inpatients (after your surgery is over and you are in a regular room).  ? ? ?Special instructions:   ?[facility]- Preparing For Surgery ? ?Before surgery, you can play an important role. Because skin is not sterile, your skin needs to be as free of germs as possible. You can reduce the number of germs on your skin by washing with CHG (chlorahexidine gluconate) Soap before surgery.  CHG is an antiseptic cleaner which kills germs and bonds with the skin to continue killing germs even after washing.   ? ?Oral Hygiene is also important to reduce your risk of infection.  Remember - BRUSH YOUR TEETH THE MORNING OF SURGERY WITH YOUR REGULAR TOOTHPASTE ? ?Please do not use if you have an allergy to CHG or antibacterial soaps. If your skin becomes reddened/irritated stop using the CHG.  ?Do not shave (including legs and underarms) for at least 48 hours prior to first CHG shower. It is OK to shave your face. ? ?Please follow these instructions carefully. ?  ?Shower the NIGHT BEFORE SURGERY and the MORNING OF SURGERY ? ?If you chose to wash your hair, wash your hair first as usual with  your normal shampoo. ? ?After you shampoo, rinse your hair and body thoroughly to remove the shampoo. ? ?Use CHG Soap as you would any other liquid soap. You can apply CHG directly to the skin and wash gently with a scrungie or a clean washcloth.  ? ?Apply the CHG Soap to your body ONLY FROM THE NECK DOWN.  Do not use on open wounds or open sores. Avoid contact with your eyes, ears, mouth and genitals (private parts). Wash Face and genitals (private parts)  with your normal soap.  ? ?Wash thoroughly, paying special attention to the area where your surgery will be performed. ? ?Thoroughly rinse your body with warm water from the neck down. ? ?DO NOT shower/wash with your normal soap after using and rinsing off the CHG Soap. ? ?Pat yourself dry with a CLEAN TOWEL. ? ?Wear CLEAN PAJAMAS to bed the night before surgery ? ?Place CLEAN SHEETS on your bed the night before your surgery ? ?DO NOT SLEEP WITH PETS. ? ? ?Day of Surgery: ?Take a shower with CHG soap. ?Do not wear jewelry  ?Do not wear lotions, powders, colognes, or deodorant. ?Do not shave 48 hours prior to surgery.  Men may shave face and neck. ?Do not bring valuables to the hospital.  [facility] is not responsible for any belongings or valuables. ? ?Wear Clean/Comfortable clothing the morning of surgery ?Remember to brush your teeth WITH YOUR REGULAR TOOTHPASTE. ?  ?Please read over the following fact sheets that you were given. ? ? ? ?If you received a COVID test during your pre-op visit  it is requested that you wear a mask when out in public, stay away from anyone that may not be feeling well and notify your surgeon if you develop symptoms. If you have been in contact with anyone that has tested positive in the last 10 days please notify you surgeon.  ?

## 2021-10-04 ENCOUNTER — Encounter (HOSPITAL_COMMUNITY): Payer: Self-pay

## 2021-10-04 ENCOUNTER — Other Ambulatory Visit: Payer: Self-pay

## 2021-10-04 ENCOUNTER — Encounter (HOSPITAL_COMMUNITY)
Admission: RE | Admit: 2021-10-04 | Discharge: 2021-10-04 | Disposition: A | Discharge status: Home / Self Care | Payer: 59 | Source: Ambulatory Visit | Attending: Orthopaedic Surgery | Admitting: Orthopaedic Surgery

## 2021-10-04 ENCOUNTER — Ambulatory Visit (HOSPITAL_COMMUNITY)
Admission: RE | Admit: 2021-10-04 | Discharge: 2021-10-04 | Disposition: A | Discharge status: Home / Self Care | Payer: 59 | Source: Ambulatory Visit | Attending: Orthopaedic Surgery | Admitting: Orthopaedic Surgery

## 2021-10-04 DIAGNOSIS — Z01818 Encounter for other preprocedural examination: Secondary | ICD-10-CM | POA: Insufficient documentation

## 2021-10-04 HISTORY — DX: Gastro-esophageal reflux disease without esophagitis: K21.9

## 2021-10-04 HISTORY — DX: Dyspnea, unspecified: R06.00

## 2021-10-04 LAB — CBC
HCT: 46.4 % (ref 39.0–52.0)
Hemoglobin: 15.5 g/dL (ref 13.0–17.0)
MCH: 30.9 pg (ref 26.0–34.0)
MCHC: 33.4 g/dL (ref 30.0–36.0)
MCV: 92.6 fL (ref 80.0–100.0)
Platelets: 180 10*3/uL (ref 150–400)
RBC: 5.01 MIL/uL (ref 4.22–5.81)
RDW: 14.3 % (ref 11.5–15.5)
WBC: 6.7 10*3/uL (ref 4.0–10.5)
nRBC: 0 % (ref 0.0–0.2)

## 2021-10-04 LAB — COMPREHENSIVE METABOLIC PANEL
ALT: 20 U/L (ref 0–44)
AST: 20 U/L (ref 15–41)
Albumin: 4.2 g/dL (ref 3.5–5.0)
Alkaline Phosphatase: 88 U/L (ref 38–126)
Anion gap: 8 (ref 5–15)
BUN: 16 mg/dL (ref 6–20)
CO2: 24 mmol/L (ref 22–32)
Calcium: 9.6 mg/dL (ref 8.9–10.3)
Chloride: 108 mmol/L (ref 98–111)
Creatinine, Ser: 0.85 mg/dL (ref 0.61–1.24)
GFR, Estimated: >60 mL/min (ref >60)
Glucose, Bld: 122 mg/dL — ABNORMAL HIGH (ref 70–99)
Potassium: 4 mmol/L (ref 3.5–5.1)
Sodium: 140 mmol/L (ref 135–145)
Total Bilirubin: 0.6 mg/dL (ref 0.3–1.2)
Total Protein: 7.4 g/dL (ref 6.5–8.1)

## 2021-10-04 LAB — TYPE AND SCREEN
ABO/RH(D): O POS
Antibody Screen: NEGATIVE

## 2021-10-04 LAB — SURGICAL PCR SCREEN
MRSA, PCR: NEGATIVE
Staphylococcus aureus: NEGATIVE

## 2021-10-04 NOTE — Progress Notes (Signed)
PCP: Allyn Kenner, MD ?Cardiologist:  Denies ? ?EKG:  10/04/21 ?CXR:  10/04/21 ?ECHO: denies ?Stress Test:  denies ?Cardiac Cath:  denies ? ?Fasting Blood Sugar-  na ?Checks Blood Sugar__na_ times a day ? ?ASA/Blood Thinner: No ? ?OSA/CPAP: NO ? ?Covid test not needed ? ?Anesthesia Review: No ? ?Patient denies shortness of breath, fever, cough, and chest pain at PAT appointment. ? ?Patient verbalized understanding of instructions provided today at the PAT appointment.  Patient asked to review instructions at home and day of surgery.   ?

## 2021-10-05 ENCOUNTER — Encounter (INDEPENDENT_AMBULATORY_CARE_PROVIDER_SITE_OTHER): Payer: Self-pay | Admitting: *Deleted

## 2021-10-05 ENCOUNTER — Ambulatory Visit (INDEPENDENT_AMBULATORY_CARE_PROVIDER_SITE_OTHER): Payer: 59 | Admitting: Surgery

## 2021-10-05 ENCOUNTER — Encounter: Payer: Self-pay | Admitting: Surgery

## 2021-10-05 VITALS — BP 132/79 | HR 78 | Ht 67.25 in | Wt 168.0 lb

## 2021-10-05 DIAGNOSIS — M4802 Spinal stenosis, cervical region: Secondary | ICD-10-CM

## 2021-10-10 ENCOUNTER — Ambulatory Visit (HOSPITAL_COMMUNITY): Payer: 59

## 2021-10-10 ENCOUNTER — Other Ambulatory Visit: Payer: Self-pay

## 2021-10-10 ENCOUNTER — Observation Stay (HOSPITAL_COMMUNITY)
Admission: RE | Admit: 2021-10-10 | Discharge: 2021-10-11 | Disposition: A | Discharge status: Home / Self Care | Payer: 59 | Attending: Orthopaedic Surgery | Admitting: Orthopaedic Surgery

## 2021-10-10 ENCOUNTER — Ambulatory Visit (HOSPITAL_BASED_OUTPATIENT_CLINIC_OR_DEPARTMENT_OTHER): Payer: 59 | Admitting: Anesthesiology

## 2021-10-10 ENCOUNTER — Ambulatory Visit (HOSPITAL_COMMUNITY): Payer: 59 | Admitting: Anesthesiology

## 2021-10-10 ENCOUNTER — Encounter (HOSPITAL_COMMUNITY)
Admission: RE | Disposition: A | Discharge status: Home / Self Care | Payer: Self-pay | Source: Home / Self Care | Attending: Orthopaedic Surgery

## 2021-10-10 ENCOUNTER — Encounter (HOSPITAL_COMMUNITY): Payer: Self-pay | Admitting: Orthopaedic Surgery

## 2021-10-10 DIAGNOSIS — I10 Essential (primary) hypertension: Secondary | ICD-10-CM

## 2021-10-10 DIAGNOSIS — F17210 Nicotine dependence, cigarettes, uncomplicated: Secondary | ICD-10-CM | POA: Insufficient documentation

## 2021-10-10 DIAGNOSIS — M4722 Other spondylosis with radiculopathy, cervical region: Secondary | ICD-10-CM

## 2021-10-10 DIAGNOSIS — M50123 Cervical disc disorder at C6-C7 level with radiculopathy: Secondary | ICD-10-CM | POA: Insufficient documentation

## 2021-10-10 DIAGNOSIS — J449 Chronic obstructive pulmonary disease, unspecified: Secondary | ICD-10-CM | POA: Insufficient documentation

## 2021-10-10 DIAGNOSIS — M50222 Other cervical disc displacement at C5-C6 level: Secondary | ICD-10-CM | POA: Diagnosis not present

## 2021-10-10 DIAGNOSIS — R1310 Dysphagia, unspecified: Secondary | ICD-10-CM | POA: Diagnosis not present

## 2021-10-10 DIAGNOSIS — M5020 Other cervical disc displacement, unspecified cervical region: Secondary | ICD-10-CM | POA: Diagnosis present

## 2021-10-10 DIAGNOSIS — M50122 Cervical disc disorder at C5-C6 level with radiculopathy: Secondary | ICD-10-CM | POA: Diagnosis present

## 2021-10-10 DIAGNOSIS — M47892 Other spondylosis, cervical region: Secondary | ICD-10-CM

## 2021-10-10 DIAGNOSIS — Z01818 Encounter for other preprocedural examination: Secondary | ICD-10-CM

## 2021-10-10 DIAGNOSIS — M4802 Spinal stenosis, cervical region: Secondary | ICD-10-CM

## 2021-10-10 HISTORY — PX: ANTERIOR CERVICAL DECOMP/DISCECTOMY FUSION: SHX1161

## 2021-10-10 LAB — ABO/RH: ABO/RH(D): O POS

## 2021-10-10 SURGERY — ANTERIOR CERVICAL DECOMPRESSION/DISCECTOMY FUSION 2 LEVELS
Anesthesia: General | Site: Spine Cervical

## 2021-10-10 MED ORDER — ACETAMINOPHEN 325 MG PO TABS
650.0000 mg | ORAL_TABLET | ORAL | Status: DC | PRN
  Administered 2021-10-10 – 2021-10-11 (×2): 650 mg via ORAL
  Filled 2021-10-10 (×2): qty 2

## 2021-10-10 MED ORDER — ATORVASTATIN CALCIUM 10 MG PO TABS
10.0000 mg | ORAL_TABLET | Freq: Every day | ORAL | Status: DC
  Administered 2021-10-10: 10 mg via ORAL
  Filled 2021-10-10: qty 1

## 2021-10-10 MED ORDER — LIDOCAINE 2% (20 MG/ML) 5 ML SYRINGE
INTRAMUSCULAR | Status: DC | PRN
  Administered 2021-10-10: 100 mg via INTRAVENOUS

## 2021-10-10 MED ORDER — FENTANYL CITRATE (PF) 250 MCG/5ML IJ SOLN
INTRAMUSCULAR | Status: AC
Start: 2021-10-10 — End: ?
  Filled 2021-10-10: qty 5

## 2021-10-10 MED ORDER — PHENOL 1.4 % MT LIQD: 1.0000 | OROMUCOSAL | Status: DC | PRN

## 2021-10-10 MED ORDER — HYDROMORPHONE HCL 1 MG/ML IJ SOLN: 0.5000 mg | INTRAMUSCULAR | Status: DC | PRN

## 2021-10-10 MED ORDER — SUGAMMADEX SODIUM 200 MG/2ML IV SOLN
INTRAVENOUS | Status: DC | PRN
  Administered 2021-10-10: 200 mg via INTRAVENOUS

## 2021-10-10 MED ORDER — GABAPENTIN 600 MG PO TABS
600.0000 mg | ORAL_TABLET | Freq: Four times a day (QID) | ORAL | Status: DC
Start: 2021-10-10 — End: 2021-10-11
  Administered 2021-10-10 – 2021-10-11 (×3): 600 mg via ORAL
  Filled 2021-10-10 (×3): qty 1

## 2021-10-10 MED ORDER — CEFAZOLIN SODIUM-DEXTROSE 2-4 GM/100ML-% IV SOLN
2.0000 g | Freq: Once | INTRAVENOUS | Status: DC
Start: 2021-10-10 — End: 2021-10-10

## 2021-10-10 MED ORDER — UMECLIDINIUM BROMIDE 62.5 MCG/ACT IN AEPB
1.0000 | INHALATION_SPRAY | Freq: Every day | RESPIRATORY_TRACT | Status: DC
Start: 2021-10-11 — End: 2021-10-11
  Filled 2021-10-10: qty 7

## 2021-10-10 MED ORDER — MIDAZOLAM HCL 2 MG/2ML IJ SOLN
INTRAMUSCULAR | Status: AC
  Filled 2021-10-10: qty 2

## 2021-10-10 MED ORDER — CHLORHEXIDINE GLUCONATE 0.12 % MT SOLN
15.0000 mL | Freq: Once | OROMUCOSAL | Status: AC
  Administered 2021-10-10: 15 mL via OROMUCOSAL
  Filled 2021-10-10: qty 15

## 2021-10-10 MED ORDER — SURGIFLO WITH THROMBIN (HEMOSTATIC MATRIX KIT) OPTIME
TOPICAL | Status: DC | PRN
  Administered 2021-10-10: 1 via TOPICAL

## 2021-10-10 MED ORDER — 0.9 % SODIUM CHLORIDE (POUR BTL) OPTIME
TOPICAL | Status: DC | PRN
  Administered 2021-10-10: 1000 mL

## 2021-10-10 MED ORDER — DEXAMETHASONE SODIUM PHOSPHATE 10 MG/ML IJ SOLN
INTRAMUSCULAR | Status: DC | PRN
  Administered 2021-10-10: 10 mg via INTRAVENOUS

## 2021-10-10 MED ORDER — SODIUM CHLORIDE 0.9 % IV SOLN: INTRAVENOUS | Status: DC

## 2021-10-10 MED ORDER — FENTANYL CITRATE (PF) 100 MCG/2ML IJ SOLN
INTRAMUSCULAR | Status: AC
Start: 2021-10-10 — End: 2021-10-11
  Filled 2021-10-10: qty 2

## 2021-10-10 MED ORDER — CELECOXIB 200 MG PO CAPS
200.0000 mg | ORAL_CAPSULE | Freq: Once | ORAL | Status: AC
Start: 2021-10-10 — End: 2021-10-10
  Administered 2021-10-10: 200 mg via ORAL
  Filled 2021-10-10: qty 1

## 2021-10-10 MED ORDER — ONDANSETRON HCL 4 MG PO TABS: 4.0000 mg | ORAL_TABLET | Freq: Four times a day (QID) | ORAL | Status: DC | PRN

## 2021-10-10 MED ORDER — MIDAZOLAM HCL 2 MG/2ML IJ SOLN
INTRAMUSCULAR | Status: DC | PRN
Start: 2021-10-10 — End: 2021-10-10
  Administered 2021-10-10: 2 mg via INTRAVENOUS

## 2021-10-10 MED ORDER — PROPOFOL 10 MG/ML IV BOLUS
INTRAVENOUS | Status: DC | PRN
Start: 2021-10-10 — End: 2021-10-10
  Administered 2021-10-10: 30 mg via INTRAVENOUS
  Administered 2021-10-10: 130 mg via INTRAVENOUS

## 2021-10-10 MED ORDER — POLYETHYLENE GLYCOL 3350 17 G PO PACK
17.0000 g | PACK | Freq: Every day | ORAL | Status: DC
  Administered 2021-10-10: 17 g via ORAL
  Filled 2021-10-10: qty 1

## 2021-10-10 MED ORDER — ROCURONIUM BROMIDE 10 MG/ML (PF) SYRINGE
PREFILLED_SYRINGE | INTRAVENOUS | Status: DC | PRN
  Administered 2021-10-10: 100 mg via INTRAVENOUS

## 2021-10-10 MED ORDER — CEFAZOLIN SODIUM-DEXTROSE 2-4 GM/100ML-% IV SOLN
INTRAVENOUS | Status: AC
  Filled 2021-10-10: qty 100

## 2021-10-10 MED ORDER — DULOXETINE HCL 60 MG PO CPEP
60.0000 mg | ORAL_CAPSULE | Freq: Every day | ORAL | Status: DC
  Administered 2021-10-11: 60 mg via ORAL
  Filled 2021-10-10: qty 1

## 2021-10-10 MED ORDER — ACETAMINOPHEN 650 MG RE SUPP: 650.0000 mg | RECTAL | Status: DC | PRN

## 2021-10-10 MED ORDER — LACTATED RINGERS IV SOLN: INTRAVENOUS | Status: DC

## 2021-10-10 MED ORDER — FLUTICASONE FUROATE-VILANTEROL 100-25 MCG/ACT IN AEPB
1.0000 | INHALATION_SPRAY | Freq: Every day | RESPIRATORY_TRACT | Status: DC
Start: 2021-10-11 — End: 2021-10-11
  Filled 2021-10-10: qty 28

## 2021-10-10 MED ORDER — ALBUTEROL SULFATE (2.5 MG/3ML) 0.083% IN NEBU: 2.5000 mg | INHALATION_SOLUTION | Freq: Four times a day (QID) | RESPIRATORY_TRACT | Status: DC | PRN

## 2021-10-10 MED ORDER — ORAL CARE MOUTH RINSE: 15.0000 mL | Freq: Once | OROMUCOSAL | Status: AC

## 2021-10-10 MED ORDER — SODIUM CHLORIDE 0.9% FLUSH: 3.0000 mL | INTRAVENOUS | Status: DC | PRN

## 2021-10-10 MED ORDER — ONDANSETRON HCL 4 MG/2ML IJ SOLN
INTRAMUSCULAR | Status: AC
  Filled 2021-10-10: qty 2

## 2021-10-10 MED ORDER — KETOROLAC TROMETHAMINE 30 MG/ML IJ SOLN
INTRAMUSCULAR | Status: AC
  Filled 2021-10-10: qty 1

## 2021-10-10 MED ORDER — PANTOPRAZOLE SODIUM 40 MG PO TBEC
40.0000 mg | DELAYED_RELEASE_TABLET | Freq: Every day | ORAL | Status: DC
  Administered 2021-10-11: 40 mg via ORAL
  Filled 2021-10-10: qty 1

## 2021-10-10 MED ORDER — OXYCODONE HCL 5 MG PO TABS
5.0000 mg | ORAL_TABLET | ORAL | Status: DC | PRN
  Administered 2021-10-10 – 2021-10-11 (×4): 5 mg via ORAL
  Filled 2021-10-10 (×4): qty 1

## 2021-10-10 MED ORDER — ALBUTEROL SULFATE HFA 108 (90 BASE) MCG/ACT IN AERS: 1.0000 | INHALATION_SPRAY | Freq: Four times a day (QID) | RESPIRATORY_TRACT | Status: DC | PRN

## 2021-10-10 MED ORDER — SODIUM CHLORIDE 0.9% FLUSH
3.0000 mL | Freq: Two times a day (BID) | INTRAVENOUS | Status: DC
  Administered 2021-10-10: 3 mL via INTRAVENOUS

## 2021-10-10 MED ORDER — METHOCARBAMOL 1000 MG/10ML IJ SOLN
500.0000 mg | Freq: Four times a day (QID) | INTRAVENOUS | Status: DC | PRN
  Filled 2021-10-10: qty 5

## 2021-10-10 MED ORDER — FENTANYL CITRATE (PF) 250 MCG/5ML IJ SOLN
INTRAMUSCULAR | Status: DC | PRN
  Administered 2021-10-10 (×2): 50 ug via INTRAVENOUS
  Administered 2021-10-10: 100 ug via INTRAVENOUS
  Administered 2021-10-10: 50 ug via INTRAVENOUS

## 2021-10-10 MED ORDER — ACETAMINOPHEN 325 MG PO TABS
650.0000 mg | ORAL_TABLET | Freq: Once | ORAL | Status: AC
  Administered 2021-10-10: 650 mg via ORAL
  Filled 2021-10-10: qty 2

## 2021-10-10 MED ORDER — VANCOMYCIN HCL IN DEXTROSE 1-5 GM/200ML-% IV SOLN
1000.0000 mg | INTRAVENOUS | Status: DC
  Filled 2021-10-10: qty 200

## 2021-10-10 MED ORDER — MENTHOL 3 MG MT LOZG: 1.0000 | LOZENGE | OROMUCOSAL | Status: DC | PRN

## 2021-10-10 MED ORDER — BUPIVACAINE HCL 0.25 % IJ SOLN
INTRAMUSCULAR | Status: DC | PRN
  Administered 2021-10-10: 6 mL

## 2021-10-10 MED ORDER — FENTANYL CITRATE (PF) 100 MCG/2ML IJ SOLN
25.0000 ug | INTRAMUSCULAR | Status: DC | PRN
  Administered 2021-10-10 (×3): 50 ug via INTRAVENOUS

## 2021-10-10 MED ORDER — METHOCARBAMOL 500 MG PO TABS
500.0000 mg | ORAL_TABLET | Freq: Four times a day (QID) | ORAL | Status: DC | PRN
  Administered 2021-10-10 – 2021-10-11 (×2): 500 mg via ORAL
  Filled 2021-10-10 (×2): qty 1

## 2021-10-10 MED ORDER — DEXAMETHASONE SODIUM PHOSPHATE 10 MG/ML IJ SOLN
INTRAMUSCULAR | Status: AC
  Filled 2021-10-10: qty 1

## 2021-10-10 MED ORDER — AMISULPRIDE (ANTIEMETIC) 5 MG/2ML IV SOLN: 10.0000 mg | Freq: Once | INTRAVENOUS | Status: DC | PRN

## 2021-10-10 MED ORDER — ACETAMINOPHEN 500 MG PO TABS
1000.0000 mg | ORAL_TABLET | Freq: Once | ORAL | Status: DC
  Filled 2021-10-10: qty 2

## 2021-10-10 MED ORDER — ONDANSETRON HCL 4 MG/2ML IJ SOLN: 4.0000 mg | Freq: Four times a day (QID) | INTRAMUSCULAR | Status: DC | PRN

## 2021-10-10 MED ORDER — CEFAZOLIN SODIUM-DEXTROSE 2-4 GM/100ML-% IV SOLN
2.0000 g | Freq: Once | INTRAVENOUS | Status: AC
  Administered 2021-10-10: 2 g via INTRAVENOUS

## 2021-10-10 MED ORDER — BUPIVACAINE HCL (PF) 0.25 % IJ SOLN
INTRAMUSCULAR | Status: AC
  Filled 2021-10-10: qty 30

## 2021-10-10 MED ORDER — LIDOCAINE 2% (20 MG/ML) 5 ML SYRINGE
INTRAMUSCULAR | Status: AC
  Filled 2021-10-10: qty 5

## 2021-10-10 MED ORDER — DOCUSATE SODIUM 100 MG PO CAPS
100.0000 mg | ORAL_CAPSULE | Freq: Two times a day (BID) | ORAL | Status: DC
  Administered 2021-10-10 – 2021-10-11 (×2): 100 mg via ORAL
  Filled 2021-10-10 (×2): qty 1

## 2021-10-10 MED ORDER — ROCURONIUM BROMIDE 10 MG/ML (PF) SYRINGE
PREFILLED_SYRINGE | INTRAVENOUS | Status: AC
  Filled 2021-10-10: qty 10

## 2021-10-10 MED ORDER — SODIUM CHLORIDE 0.9 % IV SOLN: 250.0000 mL | INTRAVENOUS | Status: DC

## 2021-10-10 MED ORDER — FENTANYL CITRATE (PF) 100 MCG/2ML IJ SOLN
INTRAMUSCULAR | Status: AC
  Filled 2021-10-10: qty 2

## 2021-10-10 MED ORDER — ONDANSETRON HCL 4 MG/2ML IJ SOLN
INTRAMUSCULAR | Status: DC | PRN
  Administered 2021-10-10: 4 mg via INTRAVENOUS

## 2021-10-10 SURGICAL SUPPLY — 48 items
BAG COUNTER SPONGE SURGICOUNT (BAG) ×2 IMPLANT
BENZOIN TINCTURE PRP APPL 2/3 (GAUZE/BANDAGES/DRESSINGS) ×2 IMPLANT
BIT DRILL SMALL W/STOP 14 (BIT) ×1 IMPLANT
BONE CC-ACS 11X14X7 6D (Bone Implant) ×4 IMPLANT
BUR ROUND FLUTED 4 SOFT TCH (BURR) ×2 IMPLANT
CHIPS BONE CANC-ACS11X14X7 6D (Bone Implant) IMPLANT
COLLAR CERV LO CONTOUR FIRM DE (SOFTGOODS) IMPLANT
CORD BIPOLAR FORCEPS 12FT (ELECTRODE) ×2 IMPLANT
DRAPE C-ARM 42X72 X-RAY (DRAPES) ×2 IMPLANT
DRAPE HALF SHEET 40X57 (DRAPES) ×2 IMPLANT
DRAPE MICROSCOPE LEICA (MISCELLANEOUS) ×2 IMPLANT
DURAPREP 6ML APPLICATOR 50/CS (WOUND CARE) ×2 IMPLANT
ELECT COATED BLADE 2.86 ST (ELECTRODE) ×2 IMPLANT
ELECT REM PT RETURN 9FT ADLT (ELECTROSURGICAL) ×2
ELECTRODE REM PT RTRN 9FT ADLT (ELECTROSURGICAL) ×1 IMPLANT
EVACUATOR 1/8 PVC DRAIN (DRAIN) ×1 IMPLANT
GAUZE SPONGE 4X4 12PLY STRL (GAUZE/BANDAGES/DRESSINGS) ×2 IMPLANT
GLOVE SRG 8 PF TXTR STRL LF DI (GLOVE) ×2 IMPLANT
GLOVE SURG ORTHO LTX SZ7.5 (GLOVE) ×4 IMPLANT
GLOVE SURG UNDER POLY LF SZ8 (GLOVE) ×2
GOWN STRL REUS W/ TWL LRG LVL3 (GOWN DISPOSABLE) ×1 IMPLANT
GOWN STRL REUS W/ TWL XL LVL3 (GOWN DISPOSABLE) ×1 IMPLANT
GOWN STRL REUS W/TWL 2XL LVL3 (GOWN DISPOSABLE) ×2 IMPLANT
GOWN STRL REUS W/TWL LRG LVL3 (GOWN DISPOSABLE) ×2
GOWN STRL REUS W/TWL XL LVL3 (GOWN DISPOSABLE) ×2
HALTER HD/CHIN CERV TRACTION D (MISCELLANEOUS) ×2 IMPLANT
HEMOSTAT SURGICEL 2X14 (HEMOSTASIS) IMPLANT
KIT BASIN OR (CUSTOM PROCEDURE TRAY) ×2 IMPLANT
KIT TURNOVER KIT B (KITS) ×2 IMPLANT
NDL 25GX 5/8IN NON SAFETY (NEEDLE) ×1 IMPLANT
NEEDLE 25GX 5/8IN NON SAFETY (NEEDLE) ×2 IMPLANT
NS IRRIG 1000ML POUR BTL (IV SOLUTION) ×2 IMPLANT
PACK ORTHO CERVICAL (CUSTOM PROCEDURE TRAY) ×2 IMPLANT
PIN FIXATION TEMP W/SHOULDER (PIN) ×1 IMPLANT
PLATE ANT CERV XTEND 2 LV 32 (Plate) ×1 IMPLANT
POSITIONER HEAD DONUT 9IN (MISCELLANEOUS) ×2 IMPLANT
RESTRAINT LIMB HOLDER UNIV (RESTRAINTS) ×1 IMPLANT
SCREW XTD VAR 4.2 SELF TAP (Screw) ×6 IMPLANT
STRIP CLOSURE SKIN 1/2X4 (GAUZE/BANDAGES/DRESSINGS) ×2 IMPLANT
SURGIFLO W/THROMBIN 8M KIT (HEMOSTASIS) ×1 IMPLANT
SUT BONE WAX W31G (SUTURE) ×2 IMPLANT
SUT VIC AB 3-0 X1 27 (SUTURE) ×2 IMPLANT
SUT VICRYL 4-0 PS2 18IN ABS (SUTURE) ×4 IMPLANT
TAPE CLOTH SURG 4X10 WHT LF (GAUZE/BANDAGES/DRESSINGS) ×1 IMPLANT
TOWEL GREEN STERILE (TOWEL DISPOSABLE) ×2 IMPLANT
TOWEL GREEN STERILE FF (TOWEL DISPOSABLE) ×2 IMPLANT
TRAY FOLEY W/BAG SLVR 16FR (SET/KITS/TRAYS/PACK)
TRAY FOLEY W/BAG SLVR 16FR ST (SET/KITS/TRAYS/PACK) IMPLANT

## 2021-10-10 NOTE — Op Note (Signed)
Preop diagnosis: C5-6 disc protrusion with spinal stenosis, C6-7 spondylosis. ? ?Postop diagnosis: Same ? ?Procedure: C5-6, C6-7 anterior cervical discectomy and fusion, allograft and plate. ? ?Surgeon:  Rodell Perna MD ? ?Assistant: Benjiman Core, PA-C medically necessary and present for the entire procedure ? ?Anesthesia General plus orotracheal ovation and 6 cc Marcaine local at closure. ? ?Implants:Implants ? ?BONE CC-ACS [id number] 6D - [id number] ? ?Inventory Item: BONE CC-ACS [id number] 6D Serial no.: [id number] Model/Cat no.: [id number]  ?Implant name: BONE CC-ACS [id number] 6D - [id number] Laterality: N/A Area: Spine Cervical  ?Manufacturer: Gerilyn Nestle FNDN Date of Manufacture:    ?Action: Implanted Number Used: 1   ?Device Identifier:  Device Identifier Type:    ? ?BONE CC-ACS [id number] 6D - [id number] ? ?Inventory Item: BONE CC-ACS [id number] 6D Serial no.: [id number] Model/Cat no.: [id number]  ?Implant name: BONE CC-ACS [id number] 6D - [id number] Laterality: N/A Area: Spine Cervical  ?Manufacturer: [employer] Date of Manufacture:    ?Action: Implanted Number Used: 1   ?Device Identifier:  Device Identifier Type:    ? ?PLATE ANT CERV XTEND 2 LV 32 - [id number] ? ?Inventory Item: PLATE ANT CERV XTEND 2 LV 32 Serial no.:  Model/Cat no.: [id number]  ?Implant name: PLATE ANT CERV XTEND 2 LV 32 - [id number] Laterality: N/A Area: Spine Cervical  ?Manufacturer: [place] Date of Manufacture:    ?Action: Implanted Number Used: 1   ?Device Identifier:  Device Identifier Type:    ? ?SCREW XTD VAR 4.2 SELF TAP - [id number] ? ?Inventory Item: SCREW XTD VAR 4.2 SELF TAP Serial no.:  Model/Cat no.: [id number]  ?Implant name: SCREW XTD VAR 4.2 SELF TAP - [id number] Laterality: N/A Area: Spine Cervical  ?Manufacturer: [employer] Date of Manufacture:    ?Action: Implanted Number Used: 6   ?Device Identifier:  Device Identifier Type:    ? ?Trays ?Note induction of general anesthesia  orotracheal inpatient preoperative Ancef prophylaxis patient had an old history of getting dizzy when needed received some penicillin many years ago but no rash no shortness of breath no wheezing.  Ancef 2 g was given patient was intubated with glide scope careful padding positioning arms tucked at the side wrist restraints for pulldown had altered traction without weight prepped with DuraPrep.  There is squared with towel sterile skin marker Betadine Steri-Drape sterile Mayo stand the head thyroid sheets and drapes.  Timeout procedure was completed.  Incision was made starting the midline extending the left based on palpable landmarks.  Platysma was divided blunt dissection down the prominent spurs at C5-6 which were large.  Patient had history of dysphagia been studied with swallowing study that showed some oral phase dysphagia in the setting of limited lower dentition and upper plate which resulted in mild prolonged oral transit of regular texture and secondary swallows to clear minimal oral residuals.  Patient been symptomatic with numbness pain worse on the right than left and had large disc protrusion at C5-6 causing spinal stenosis.  Anterior spurs removed with the bare rondure self-retaining tractors were placed teeth blades right and left underneath the longus Coley.  Smooth blade cephalad caudad.  Discectomy was performed.  Operative microscope was brought in bur was used to remove some spurs and removed some of the endplate.  Cloward curettes were used.  Dissection down the posterior longitudinal ligament were large chunks of disc that were protruding and held by the ligament were taken down posterior longitudinal ligament was completely removed so the dura was decompressed no remaining fragments  were present.  Trial sizers showed a 7 mm graft fit the best and was marked anteriorly vertically inserted countersunk 1 to 2 mm and was secure. ? ?Identical procedure repeated at C6-7 level.  6 mm graft was  placed at this level and there were posterior spurs with 1 mm gap space between the spurs and these had to be removed by slowly thinning him with the bur and then using curettes to pick the pieces until 1 mm Kerrison would fit underneath the spurs to remove them.  Once dura was decompressed fully 6 mm graft was again marked placed followed by plate adjusting with C arm using the spike following up 1 proximal hole adjusting the plate and then filling all holes final spot pictures were taken showed good position AP and lateral.  Operative field was dry Hemovac was placed in and out technique on the left side in line with the skin incision.  3 oh platysma closure 4-0 Vicryl subcuticular closure.  PA was medically necessary for retraction and protection of neural elements retractor placement and present for the entire procedure.  Patient was transferred to care room in stable condition. ?

## 2021-10-10 NOTE — Anesthesia Preprocedure Evaluation (Addendum)
Anesthesia Evaluation  ?Patient identified by MRN, date of birth, ID band ?Patient awake ? ? ? ?Reviewed: ?Allergy & Precautions, NPO status , Patient's Chart, lab work & pertinent test results ? ?History of Anesthesia Complications ?Negative for: history of anesthetic complications ? ?Airway ?Mallampati: I ? ?TM Distance: >3 FB ?Neck ROM: Full ? ? ? Dental ? ?(+) Edentulous Upper, Missing, Poor Dentition, Dental Advisory Given ?  ?Pulmonary ?COPD,  COPD inhaler, Current Smoker and Patient abstained from smoking.,  ?  ?Pulmonary exam normal ? ? ? ? ? ? ? Cardiovascular ?hypertension, Normal cardiovascular exam ? ? ?  ?Neuro/Psych ?negative neurological ROS ?   ? GI/Hepatic ?Neg liver ROS, GERD  Medicated,  ?Endo/Other  ?negative endocrine ROS ? Renal/GU ?negative Renal ROS  ? ?  ?Musculoskeletal ?negative musculoskeletal ROS ?(+)  ? Abdominal ?  ?Peds ? Hematology ?negative hematology ROS ?(+)   ?Anesthesia Other Findings ? ? Reproductive/Obstetrics ? ?  ? ? ? ? ? ? ? ? ? ? ? ? ? ?  ?  ? ? ? ? ? ? ? ?Anesthesia Physical ?Anesthesia Plan ? ?ASA: 3 ? ?Anesthesia Plan: General  ? ?Post-op Pain Management: Celebrex PO (pre-op)* and Tylenol PO (pre-op)*  ? ?Induction: Intravenous ? ?PONV Risk Score and Plan: 2 and Ondansetron and Dexamethasone ? ?Airway Management Planned: Oral ETT ? ?Additional Equipment:  ? ?Intra-op Plan:  ? ?Post-operative Plan: Extubation in OR ? ?Informed Consent: I have reviewed the patients History and Physical, chart, labs and discussed the procedure including the risks, benefits and alternatives for the proposed anesthesia with the patient or authorized representative who has indicated his/her understanding and acceptance.  ? ? ? ?Dental advisory given ? ?Plan Discussed with: Anesthesiologist, CRNA and Surgeon ? ?Anesthesia Plan Comments:   ? ? ? ? ? ?Anesthesia Quick Evaluation ? ?

## 2021-10-10 NOTE — Progress Notes (Signed)
Julian Perez is an 56 y.o. male.   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  Symptoms progressively worsening.  Failed conservative treatment.  Today history and physical performed.  Patient states he has chronic issues with dysphagia to solids and liquids.  Has not been evaluated by GI.  Wife is trying to help get him an appointment.  Patient had barium swallow study September 20, 2021 and results were: ?  ?  Plan - 09/20/21 1542   ?  ?  Clinical Impression Statement Pt presents with min oral phase dysphagia in setting of limited lower dentition and upper plate which resulted in mildly prolonged oral transit of regular textures and secondary swallow to clear min oral residuals. Swallow trigger was at the level of the valleculae across consistencies and textures, hyolaryngeal excursion and tongue base retraction are WFL, flash trace penetration during the swallow with thins without aspiration and was consistently removed. Pt reported that swallowing felt effortful when he was swallowing the min residuals of regular textures. Overall oropharyngeal swallow was WNL and esophageal sweep was unremarkable. Pt was encouraged to keep a swallowing journal to record when he has trouble swallowing (when it occurs, what it occurs with, etc) due to variance in his symptoms (comes out nose, chokes on liquids, solids won't go down) over the past "couple of years". He reports that he has surgery next month on his "neck". He was given my contact information should he have further questions of concerns, and information on esophageal swallow precautions and COPD/swallow precautions. No further SLP services indicated at this time.   ?  ?   ?  ?  ?   ?  ?  ?Patient will benefit from skilled therapeutic intervention in order to improve the following deficits and impairments:   ?Dysphagia, oropharyngeal  phase ?  ?  ?  ?  Recommendations/Treatment - 09/20/21 1711   ?  ?       ?       ?  Swallow Evaluation Recommendations  ?  Recommended Consults Consider GI evaluation   ?  SLP Diet Recommendations Thin;Age appropriate regular   ?  Liquid Administration via Cup;Straw   ?  Medication Administration Whole meds with liquid   ?  Supervision Patient able to self feed   ?  Postural Changes Seated upright at 90 degrees;Remain upright for at least 30 minutes after feeds/meals   ?  ?  ?  ?    ?Past Medical History:  ?Diagnosis Date  ? Arthritis    ? COPD (chronic obstructive pulmonary disease) ([place])    ? Dyspnea    ? GERD (gastroesophageal reflux disease)    ? Hypertension    ? Neck pain    ? Shoulder pain    ?  ?  ?     ?Past Surgical History:  ?Procedure Laterality Date  ? NO PAST SURGERIES      ?  ?  ?No family history on file. ?Social History:  reports that he has been smoking cigarettes. He has been smoking an average of .25 packs per day. He has never used smokeless tobacco. He reports that he does not drink alcohol and does not use drugs. ?  ?Allergies:  ?     ?Allergies  ?Allergen Reactions  ? Penicillins Other (See Comments)  ?    DIZZINESS ?   ?  ?  ?  No medications prior to admission.  ?  ?  ?Lab Results Last 48 Hours  ?No results found for this or any previous visit (from the past 48 hour(s)).  ? ?Imaging Results (Last 48 hours)  ?No results found.  ? ?  ?Review of Systems  ?Constitutional:  Positive for activity change.  ?HENT:  Positive for trouble swallowing (complains of chronic dysphagia with solids and liquids).   ?Respiratory: Negative.    ?Cardiovascular: Negative.   ?Genitourinary: Negative.   ?Musculoskeletal:  Positive for neck pain and neck stiffness.  ?Neurological:  Positive for numbness.  ?  ?There were no vitals taken for this visit. ?Physical Exam ?HENT:  ?   Head: Normocephalic and atraumatic.  ?   Nose: Nose normal.  ?Cardiovascular:  ?   Rate and Rhythm: Regular rhythm.  ?   Heart sounds: Normal  heart sounds.  ?Pulmonary:  ?   Effort: Pulmonary effort is normal. No respiratory distress.  ?Abdominal:  ?   General: Bowel sounds are normal.  ?Neurological:  ?   Mental Status: He is alert and oriented to person, place, and time.  ?Psychiatric:     ?   Mood and Affect: Mood normal.  ?  ?  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  ?Surgical procedure along with possible risks and complications discussed with patient and wife who is present.  With patient's chronic history of dysphagia I did advise him that these symptoms could worsen temporarily after cervical fusion. I told patient that I am aware of the recent barium swallow study and I advised Dr Lorin Mercy of this also. I stressed to patient the importance of being compliant with collar after surgery.  I did ask patient if he wanted to be evaluated by GI before proceeding with surgery and he states that he wants to proceed with ACDF.  He and his wife voice understanding of postop risks and wish to proceed.  They will work on getting appointment with GI for evaluation at some point.    ?  ? ?

## 2021-10-10 NOTE — Anesthesia Postprocedure Evaluation (Signed)
Anesthesia Post Note ? ?Patient: Julian Perez ? ?Procedure(s) Performed: CERVICAL FIVE-SIX, CERVICAL SIX-SEVEN ANTERIOR CERVICAL DECOMPRESSION/DISCECTOMY FUSION (Spine Cervical) ? ?  ? ?Patient location during evaluation: PACU ?Anesthesia Type: General ?Level of consciousness: sedated, patient cooperative and oriented ?Pain management: pain level controlled ?Vital Signs Assessment: post-procedure vital signs reviewed and stable ?Respiratory status: spontaneous breathing, nonlabored ventilation and respiratory function stable ?Cardiovascular status: blood pressure returned to baseline and stable ?Postop Assessment: no apparent nausea or vomiting ?Anesthetic complications: no ? ? ?No notable events documented. ? ?Last Vitals:  ?Vitals:  ? 10/10/21 1630 10/10/21 1715  ?BP: (!) 160/90 (!) 174/83  ?Pulse: 65 60  ?Resp: 13 18  ?Temp: 36.7 ?C   ?SpO2: 99% 96%  ?  ?Last Pain:  ?Vitals:  ? 10/10/21 1630  ?TempSrc:   ?PainSc: Asleep  ? ? ?  ?  ?  ?  ?  ?  ? ?Jalise Zawistowski,E. Ziva Nunziata ? ? ? ? ?

## 2021-10-10 NOTE — Anesthesia Procedure Notes (Signed)
Procedure Name: Intubation ?Date/Time: 10/10/2021 12:42 PM ?Performed by: Mariea Clonts, CRNA ?Pre-anesthesia Checklist: Patient identified, Emergency Drugs available, Suction available and Patient being monitored ?Patient Re-evaluated:Patient Re-evaluated prior to induction ?Oxygen Delivery Method: Circle System Utilized ?Preoxygenation: Pre-oxygenation with 100% oxygen ?Induction Type: IV induction ?Ventilation: Mask ventilation without difficulty ?Laryngoscope Size: Sabra Heck and 2 ?Grade View: Grade I ?Tube type: Oral ?Tube size: 7.5 mm ?Number of attempts: 1 ?Airway Equipment and Method: Stylet and Oral airway ?Placement Confirmation: ETT inserted through vocal cords under direct vision, positive ETCO2 and breath sounds checked- equal and bilateral ?Tube secured with: Tape ?Dental Injury: Teeth and Oropharynx as per pre-operative assessment  ? ? ? ? ?

## 2021-10-10 NOTE — Transfer of Care (Signed)
Immediate Anesthesia Transfer of Care Note ? ?Patient: Julian Perez ? ?Procedure(s) Performed: CERVICAL FIVE-SIX, CERVICAL SIX-SEVEN ANTERIOR CERVICAL DECOMPRESSION/DISCECTOMY FUSION (Spine Cervical) ? ?Patient Location: PACU ? ?Anesthesia Type:General ? ?Level of Consciousness: awake, alert  and oriented ? ?Airway & Oxygen Therapy: Patient Spontanous Breathing and Patient connected to nasal cannula oxygen ? ?Post-op Assessment: Report given to RN and Post -op Vital signs reviewed and stable ? ?Post vital signs: Reviewed and stable ? ?Last Vitals:  ?Vitals Value Taken Time  ?BP 161/89 10/10/21 1500  ?Temp    ?Pulse 81 10/10/21 1500  ?Resp 22 10/10/21 1500  ?SpO2 90 % 10/10/21 1500  ?Vitals shown include unvalidated device data. ? ?Last Pain:  ?Vitals:  ? 10/10/21 1053  ?TempSrc:   ?PainSc: 7   ?   ? ?Patients Stated Pain Goal: 3 (10/10/21 1053) ? ?Complications: No notable events documented. ?

## 2021-10-10 NOTE — Progress Notes (Signed)
Orthopedic Tech Progress Note ?Patient Details:  ?Julian Perez ?[birth year] ?[id number] ? ?Ortho Devices ?Type of Ortho Device: Soft collar ?  ?  ? ?[name] ?10/10/2021, 6:33 PM ?Delivered collar off to RN in room 4  ?

## 2021-10-10 NOTE — H&P (Signed)
Julian Perez is an 56 y.o. male.   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  Symptoms progressively worsening.  Failed conservative treatment.  Today history and physical performed.  Patient states he has chronic issues with dysphagia to solids and liquids.  Has not been evaluated by GI.  Wife is trying to help get him an appointment.  Patient had barium swallow study September 20, 2021 and results were: ? ?  Plan - 09/20/21 1542   ?  ?  Clinical Impression Statement Pt presents with min oral phase dysphagia in setting of limited lower dentition and upper plate which resulted in mildly prolonged oral transit of regular textures and secondary swallow to clear min oral residuals. Swallow trigger was at the level of the valleculae across consistencies and textures, hyolaryngeal excursion and tongue base retraction are WFL, flash trace penetration during the swallow with thins without aspiration and was consistently removed. Pt reported that swallowing felt effortful when he was swallowing the min residuals of regular textures. Overall oropharyngeal swallow was WNL and esophageal sweep was unremarkable. Pt was encouraged to keep a swallowing journal to record when he has trouble swallowing (when it occurs, what it occurs with, etc) due to variance in his symptoms (comes out nose, chokes on liquids, solids won't go down) over the past "couple of years". He reports that he has surgery next month on his "neck". He was given my contact information should he have further questions of concerns, and information on esophageal swallow precautions and COPD/swallow precautions. No further SLP services indicated at this time.   ?  ?   ?  ?  ?   ?  ?  ?Patient will benefit from skilled therapeutic intervention in order to improve the following deficits and impairments:   ?Dysphagia, oropharyngeal phase ?   ?  ?  ?  Recommendations/Treatment - 09/20/21 1711   ?  ?       ?       ?  Swallow Evaluation Recommendations  ?  Recommended Consults Consider GI evaluation   ?  SLP Diet Recommendations Thin;Age appropriate regular   ?  Liquid Administration via Cup;Straw   ?  Medication Administration Whole meds with liquid   ?  Supervision Patient able to self feed   ?  Postural Changes Seated upright at 90 degrees;Remain upright for at least 30 minutes after feeds/meals   ?  ? ? ?Past Medical History:  ?Diagnosis Date  ? Arthritis   ? COPD (chronic obstructive pulmonary disease) ([place])   ? Dyspnea   ? GERD (gastroesophageal reflux disease)   ? Hypertension   ? Neck pain   ? Shoulder pain   ? ? ?Past Surgical History:  ?Procedure Laterality Date  ? NO PAST SURGERIES    ? ? ?No family history on file. ?Social History:  reports that he has been smoking cigarettes. He has been smoking an average of .25 packs per day. He has never used smokeless tobacco. He reports that he does not drink alcohol and does not use drugs. ? ?Allergies:  ?Allergies  ?Allergen Reactions  ? Penicillins Other (See Comments)  ?  DIZZINESS ?  ? ? ?No medications prior to admission.  ? ? ?No results found for this or any previous visit (from the past 48 hour(s)). ?No results found. ? ?Review of Systems  ?Constitutional:  Positive  for activity change.  ?HENT:  Positive for trouble swallowing (complains of chronic dysphagia with solids and liquids).   ?Respiratory: Negative.    ?Cardiovascular: Negative.   ?Genitourinary: Negative.   ?Musculoskeletal:  Positive for neck pain and neck stiffness.  ?Neurological:  Positive for numbness.  ? ?There were no vitals taken for this visit. ?Physical Exam ?HENT:  ?   Head: Normocephalic and atraumatic.  ?   Nose: Nose normal.  ?Cardiovascular:  ?   Rate and Rhythm: Regular rhythm.  ?   Heart sounds: Normal heart sounds.  ?Pulmonary:  ?   Effort: Pulmonary effort is normal. No respiratory distress.  ?Abdominal:  ?    General: Bowel sounds are normal.  ?Neurological:  ?   Mental Status: He is alert and oriented to person, place, and time.  ?Psychiatric:     ?   Mood and Affect: Mood normal.  ?  ? 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 ?Surgical procedure along with possible risks and complications discussed with patient and wife who is present.  With patient's chronic history of dysphagia I did advise him that these symptoms could worsen temporarily after cervical fusion. I told patient that I am aware of the recent barium swallow study and I advised Dr Lorin Mercy of this also. I stressed to patient the importance of being compliant with collar after surgery.  I did ask patient if he wanted to be evaluated by GI before proceeding with surgery and he states that he wants to proceed with ACDF.  He and his wife voice understanding of postop risks and wish to proceed.  They will work on getting appointment with GI for evaluation at some point.    ? ?Benjiman Core, PA-C ?10/10/2021, 10:11 AM ? ? ? ?

## 2021-10-10 NOTE — Progress Notes (Signed)
Switched antibiotic to ancef 2g per a verbal order from Dr. Lorin Mercy.  ?

## 2021-10-10 NOTE — Interval H&P Note (Signed)
History and Physical Interval Note: ? ?10/10/2021 ?12:17 PM ? ?Julian Perez  has presented today for surgery, with the diagnosis of C5-6, C6-7 spondylosis, stenosis.  The various methods of treatment have been discussed with the patient and family. After consideration of risks, benefits and other options for treatment, the patient has consented to  Procedure(s): ?C5-6, C6-7 ANTERIOR CERVICAL DISCECTOMY FUSION, ALLOGRAFT, PLATE (N/A) as a surgical intervention.  The patient's history has been reviewed, patient examined, no change in status, stable for surgery.  I have reviewed the patient's chart and labs.  Questions were answered to the patient's satisfaction.   ? ? ?Marybelle Killings ? ? ?

## 2021-10-11 ENCOUNTER — Encounter (HOSPITAL_COMMUNITY): Payer: Self-pay | Admitting: Orthopaedic Surgery

## 2021-10-11 DIAGNOSIS — M50122 Cervical disc disorder at C5-C6 level with radiculopathy: Secondary | ICD-10-CM | POA: Diagnosis not present

## 2021-10-11 NOTE — Evaluation (Signed)
Occupational Therapy Evaluation ?Patient Details ?Name: Julian Perez ?MRN: [id number] ?DOB: 08/06/1965 ?Today's Date: 10/11/2021 ? ? ?History of Present Illness 56 yo s/p C5-6, C6-7 anterior cervical discectomy and fusion, allograft and plate. OPMH: arthritis, COPD, dyspnea, HTN, shoulder pian, dysphagia.  ? ?Clinical Impression ?  ?Completed education regarding compensatory strategies and use of DME/AE to maximize safety and independence with ADL and functional mobility. Pt able to return demonstrate. No further OT needed. Pt discussed feeling dizzy at times with change of position. If this continues to be an issue, recommend pt follow up with PT to r/o BPPV. OT signing off.  ?   ? ?Recommendations for follow up therapy are one component of a multi-disciplinary discharge planning process, led by the attending physician.  Recommendations may be updated based on patient status, additional functional criteria and insurance authorization.  ? ?Follow Up Recommendations ? No OT follow up  ?  ?Assistance Recommended at Discharge Set up Supervision/Assistance  ?Patient can return home with the following Assist for transportation ? ?  ?Functional Status Assessment ? Patient has not had a recent decline in their functional status  ?Equipment Recommendations ? None recommended by OT  ?  ?Recommendations for Other Services   ? ? ?  ?Precautions / Restrictions Precautions ?Precautions: Cervical ?Required Braces or Orthoses: Cervical Brace ?Cervical Brace: Soft collar;At all times  ? ?  ? ?Mobility Bed Mobility ?  ?  ?  ?  ?  ?  ?  ?  ?  ? ?Transfers ?Overall transfer level: Independent ?  ?  ?  ?  ?  ?  ?  ?  ?  ?  ? ?  ?Balance Overall balance assessment: Mild deficits observed, not formally tested ?  ?  ?  ?  ?  ?  ?  ?  ?  ?  ?  ?  ?  ?  ?  ?  ?  ?  ?   ? ?ADL either performed or assessed with clinical judgement  ? ?ADL   ?  ?  ?  ?  ?  ?  ?  ?  ?  ?  ?  ?  ?  ?  ?  ?  ?  ?  ?  ?   ? ? ? ?Vision Baseline Vision/History: 0 No  visual deficits ?   ?   ?Perception   ?  ?Praxis   ?  ? ?Pertinent Vitals/Pain Pain Assessment ?Pain Assessment: 0-10 ?Pain Score: 6  ?Pain Location: neck  ? ? ? ?Hand Dominance Right ?  ?Extremity/Trunk Assessment Upper Extremity Assessment ?Upper Extremity Assessment: LUE deficits/detail ?LUE Deficits / Details: numbness L thumb/fingertips; improving since surgery; L grip weaker than R ?LUE Sensation: decreased light touch ?LUE Coordination:  (drops items at times with L hand) ?  ?  ?  ?Cervical / Trunk Assessment ?Cervical / Trunk Assessment: Neck Surgery ?  ?Communication Communication ?Communication: No difficulties ?  ?Cognition Arousal/Alertness: Awake/alert ?Behavior During Therapy: [facility] for tasks assessed/performed ?Overall Cognitive Status: Within Functional Limits for tasks assessed ?  ?  ?  ?  ?  ?  ?  ?  ?  ?  ?  ?  ?  ?  ?  ?  ?  ?  ?  ?General Comments  c/o "dizziness" with movement at times; Discussed possible need for further assessment to r/o BPPV once neck has healed ? ?  ?Exercises   ?  ?Shoulder Instructions    ? ? ?  Home Living Family/patient expects to be discharged to:: Private residence ?Living Arrangements: Spouse/significant other;Children ?Available Help at Discharge: Available 24 hours/day ?Type of Home: House ?Home Access: Ramped entrance ?  ?  ?Home Layout: One level ?  ?  ?Bathroom Shower/Tub: Tub/shower unit;Curtain ?  ?Bathroom Toilet: Standard ?Bathroom Accessibility: Yes ?How Accessible: Accessible via walker ?Home Equipment: Conservation officer, nature (2 wheels);Cane - single point;Shower seat;Grab bars - tub/shower ?  ?  ?  ? ?  ?Prior Functioning/Environment Prior Level of Function : Independent/Modified Independent ?  ?  ?  ?  ?  ?  ?Mobility Comments: used cane ?  ?  ? ?  ?  ?OT Problem List: Decreased strength;Decreased knowledge of use of DME or AE;Decreased knowledge of precautions;Pain ?  ?   ?OT Treatment/Interventions:    ?  ?OT Goals(Current goals can be found in the care plan  section) Acute Rehab OT Goals ?Patient Stated Goal: to get rid of the numbness ?OT Goal Formulation: All assessment and education complete, DC therapy  ?OT Frequency:   ?  ? ?Co-evaluation   ?  ?  ?  ?  ? ?  ?AM-PAC OT "6 Clicks" Daily Activity     ?Outcome Measure Help from another person eating meals?: None ?Help from another person taking care of personal grooming?: None ?Help from another person toileting, which includes using toliet, bedpan, or urinal?: None ?Help from another person bathing (including washing, rinsing, drying)?: None ?Help from another person to put on and taking off regular upper body clothing?: None ?Help from another person to put on and taking off regular lower body clothing?: None ?6 Click Score: 24 ?  ?End of Session Nurse Communication: Other (comment) (DC needs) ? ?Activity Tolerance: Patient tolerated treatment well ?Patient left: in bed;with call bell/phone within reach ? ?OT Visit Diagnosis: Muscle weakness (generalized) (M62.81);Pain ?Pain - part of body:  (neck)  ?              ?Time: 2202-5427 ?OT Time Calculation (min): 22 min ?Charges:  OT General Charges ?$OT Visit: 1 Visit ?OT Evaluation ?$OT Eval Low Complexity: 1 Low ? ?[facility], OT/L  ? ?Acute OT Clinical Specialist ?Acute Rehabilitation Services ?Pager [phone] ?Office [phone]  ? ?[name],[name] ?10/11/2021, 8:48 AM ?

## 2021-10-11 NOTE — Progress Notes (Signed)
Patient alert and oriented, mae's well, voiding adequate amount of urine, swallowing without difficulty, no c/o pain at time of discharge. Patient discharged home with family. Script and discharged instructions given to patient. Patient and family stated understanding of instructions given. Patient has an appointment with Dr. Lorin Mercy in 1 week ?

## 2021-10-11 NOTE — Progress Notes (Signed)
Patient ID: Julian Perez, male   DOB: 30-Aug-1965, 56 y.o.   MRN: [id number] ? ? ?Subjective: ?1 Day Post-Op Procedure(s) (LRB): ?CERVICAL FIVE-SIX, CERVICAL SIX-SEVEN ANTERIOR CERVICAL DECOMPRESSION/DISCECTOMY FUSION (N/A) ?Patient reports pain as mild.   ? ?Objective: ?Vital signs in last 24 hours: ?Temp:  [97.7 ?F (36.5 ?C)-98.8 ?F (37.1 ?C)] 98.2 ?F (36.8 ?C) (04/11 9702) ?Pulse Rate:  [57-86] 57 (04/11 0731) ?Resp:  [13-23] 16 (04/11 0731) ?BP: (134-174)/(76-97) 152/85 (04/11 0731) ?SpO2:  [89 %-99 %] 95 % (04/11 0731) ?Weight:  [75.8 kg] 75.8 kg (04/10 1041) ? ?Intake/Output from previous day: ?04/10 0701 - 04/11 0700 ?In: 6378 [P.O.:120; I.V.:1100; IV Piggyback:100] ?Out: 480 [Urine:400; Drains:50; Blood:30] ?Intake/Output this shift: ?No intake/output data recorded. ? ?No results for input(s): HGB in the last 72 hours. ?No results for input(s): WBC, RBC, HCT, PLT in the last 72 hours. ?No results for input(s): NA, K, CL, CO2, BUN, CREATININE, GLUCOSE, CALCIUM in the last 72 hours. ?No results for input(s): LABPT, INR in the last 72 hours. ? ?Neurologically intact ?DG Cervical Spine 2 or 3 views ? ?Result Date: 10/10/2021 ?CLINICAL DATA:  Cervical spine surgery. EXAM: CERVICAL SPINE - 2-3 VIEW COMPARISON:  Preoperative MRI 06/08/2021 FINDINGS: Three fluoroscopic spot views of the cervical spine obtained in the operating room. Anterior C5-C7 fusion. Fluoroscopy time 23 seconds. Dose 3.07 mGy. IMPRESSION: Intraoperative fluoroscopy during anterior C5-C7 fusion. Electronically Signed   By: Keith Rake M.D.   On: 10/10/2021 15:48  ? ?DG C-Arm 1-60 Min-No Report ? ?Result Date: 10/10/2021 ?Fluoroscopy was utilized by the requesting physician.  No radiographic interpretation.  ? ?DG C-Arm 1-60 Min-No Report ? ?Result Date: 10/10/2021 ?Fluoroscopy was utilized by the requesting physician.  No radiographic interpretation.   ? ?Assessment/Plan: ?1 Day Post-Op Procedure(s) (LRB): ?CERVICAL FIVE-SIX, CERVICAL SIX-SEVEN  ANTERIOR CERVICAL DECOMPRESSION/DISCECTOMY FUSION (N/A) ?Plan; discharge home office one week.  ? ? ?Marybelle Killings ?10/11/2021, 8:14 AM ? ? ? ? ? ?

## 2021-10-11 NOTE — Discharge Instructions (Signed)
Keep collar on at all times.  You can take the collar off when you shower when you apply the extra collar wrapped with Saran wrap.  After you shower dry off and then swap back to the collar without moving your neck applying the dry collar.  You have list problems with swelling and swelling if you sleep in a recliner beachchair position for a week or so.  See Dr. Lorin Mercy in 1 week.  You do not need to do anything to your dressing ?

## 2021-10-13 ENCOUNTER — Other Ambulatory Visit: Payer: Self-pay

## 2021-10-18 NOTE — Discharge Summary (Signed)
? ?Patient ID: ?Raechel Ache ?MRN: [id number] ?DOB/AGE: 56/13/1967 56 y.o. ? ?Admit date: 10/10/2021 ?Discharge date: 10/11/2021 ? ?Admission Diagnoses:  ?Principal Problem: ?  HNP (herniated nucleus pulposus), cervical ?Active Problems: ?  Other spondylosis with radiculopathy, cervical region ? ? ?Discharge Diagnoses:  ?Principal Problem: ?  HNP (herniated nucleus pulposus), cervical ?Active Problems: ?  Other spondylosis with radiculopathy, cervical region ? status post Procedure(s): ?CERVICAL FIVE-SIX, CERVICAL SIX-SEVEN ANTERIOR CERVICAL DECOMPRESSION/DISCECTOMY FUSION ? ?Past Medical History:  ?Diagnosis Date  ? Arthritis   ? COPD (chronic obstructive pulmonary disease) ([place])   ? Dyspnea   ? GERD (gastroesophageal reflux disease)   ? Hypertension   ? Neck pain   ? Shoulder pain   ? ? ?Surgeries: Procedure(s): ?CERVICAL FIVE-SIX, CERVICAL SIX-SEVEN ANTERIOR CERVICAL DECOMPRESSION/DISCECTOMY FUSION on 10/10/2021 ?  ?Consultants:  ? ?Discharged Condition: Improved ? ?Hospital Course: Maleek Craver is an 56 y.o. male who was admitted 10/10/2021 for operative treatment of HNP (herniated nucleus pulposus), cervical. Patient failed conservative treatments (please see the history and physical for the specifics) and had severe unremitting pain that affects sleep, daily activities and work/hobbies. After pre-op clearance, the patient was taken to the operating room on 10/10/2021 and underwent  Procedure(s): ?CERVICAL FIVE-SIX, CERVICAL SIX-SEVEN ANTERIOR CERVICAL DECOMPRESSION/DISCECTOMY FUSION.   ? ?Patient was given perioperative antibiotics:  ?Anti-infectives (From admission, onward)  ? ? Start     Dose/Rate Route Frequency Ordered Stop  ? 10/10/21 1230  ceFAZolin (ANCEF) IVPB 2g/100 mL premix       ?Note to Pharmacy: He gets dizzy with PCN, no rash , no dyspnea, no wheezing.   NOT ALLERGIC TO PCN  ? 2 g ?200 mL/hr over 30 Minutes Intravenous  Once 10/10/21 1217 10/10/21 1322  ? 10/10/21 1230  ceFAZolin (ANCEF) IVPB 2g/100  mL premix  Status:  Discontinued       ? 2 g ?200 mL/hr over 30 Minutes Intravenous  Once 10/10/21 1227 10/10/21 1654  ? 10/10/21 1216  ceFAZolin (ANCEF) 2-4 GM/100ML-% IVPB       ?Note to Pharmacy: Rocky Morel D: cabinet override  ?    10/10/21 1216 10/10/21 1302  ? 10/10/21 1045  vancomycin (VANCOCIN) IVPB 1000 mg/200 mL premix  Status:  Discontinued       ? 1,000 mg ?200 mL/hr over 60 Minutes Intravenous On call to O.R. 10/10/21 1035 10/10/21 1227  ? ?  ?  ? ?Patient was given sequential compression devices and early ambulation to prevent DVT.  ? ?Patient benefited maximally from hospital stay and there were no complications. At the time of discharge, the patient was urinating/moving their bowels without difficulty, tolerating a regular diet, pain is controlled with oral pain medications and they have been cleared by PT/OT.  ? ?Recent vital signs: No data found.  ? ?Recent laboratory studies: No results for input(s): WBC, HGB, HCT, PLT, NA, K, CL, CO2, BUN, CREATININE, GLUCOSE, INR, CALCIUM in the last 72 hours. ? ?Invalid input(s): PT, 2 ? ? ?Discharge Medications:   ?Allergies as of 10/11/2021   ? ?   Reactions  ? Penicillins Other (See Comments)  ? DIZZINESS  ? ?  ? ?  ?Medication List  ?  ? ?TAKE these medications   ? ?albuterol 108 (90 Base) MCG/ACT inhaler ?Commonly known as: VENTOLIN HFA ?Inhale 1-2 puffs into the lungs every 6 (six) hours as needed for wheezing or shortness of breath. ?  ?albuterol (2.5 MG/3ML) 0.083% nebulizer solution ?Commonly known as: PROVENTIL ?Take 2.5 mg by  nebulization every 6 (six) hours as needed for wheezing or shortness of breath. ?  ?atorvastatin 10 MG tablet ?Commonly known as: LIPITOR ?Take 10 mg by mouth at bedtime. ?  ?DULoxetine 60 MG capsule ?Commonly known as: CYMBALTA ?Take 60 mg by mouth daily. ?  ?gabapentin 600 MG tablet ?Commonly known as: NEURONTIN ?Take 600 mg by mouth 4 (four) times daily. ?  ?methocarbamol 750 MG tablet ?Commonly known as: ROBAXIN ?Take  750 mg by mouth 3 (three) times daily. ?  ?omeprazole 40 MG capsule ?Commonly known as: PRILOSEC ?Take 40 mg by mouth daily. ?  ?oxyCODONE-acetaminophen 10-325 MG tablet ?Commonly known as: PERCOCET ?Take 1 tablet by mouth every 6 (six) hours as needed for pain. ?  ?Trelegy Ellipta 100-62.5-25 MCG/ACT Aepb ?Generic drug: Fluticasone-Umeclidin-Vilant ?Take 1 puff by mouth daily. ?  ? ?  ? ? ?Diagnostic Studies: DG Chest 2 View ? ?Result Date: 10/04/2021 ?CLINICAL DATA:  56 year old male with a history upcoming surgery EXAM: CHEST - 2 VIEW COMPARISON:  08/26/2021 FINDINGS: Cardiomediastinal silhouette unchanged in size and contour. No evidence of central vascular congestion. No interlobular septal thickening. No pneumothorax or pleural effusion. Coarsened interstitial markings, with no confluent airspace disease. No acute displaced fracture. Degenerative changes of the spine. IMPRESSION: Negative for acute cardiopulmonary disease Electronically Signed   By: Corrie Mckusick D.O.   On: 10/04/2021 16:12  ? ?DG Cervical Spine 2 or 3 views ? ?Result Date: 10/10/2021 ?CLINICAL DATA:  Cervical spine surgery. EXAM: CERVICAL SPINE - 2-3 VIEW COMPARISON:  Preoperative MRI 06/08/2021 FINDINGS: Three fluoroscopic spot views of the cervical spine obtained in the operating room. Anterior C5-C7 fusion. Fluoroscopy time 23 seconds. Dose 3.07 mGy. IMPRESSION: Intraoperative fluoroscopy during anterior C5-C7 fusion. Electronically Signed   By: Keith Rake M.D.   On: 10/10/2021 15:48  ? ?DG Swallowing Func-Speech Pathology ? ?Result Date: 09/21/2021 ?Table formatting from the original result was not included. [place], [place], [address] Phone: [phone]   Fax:  [phone]   Modified Barium Swallow   Patient Details Name: Tieler Cournoyer MRN: [id number] Date of Birth: 1965/09/20 No data recorded   Encounter Date: 09/20/2021     End of Session - 09/20/21 1712      Visit  Number 1    Number of Visits 1    Authorization Type [employer]    SLP Start Time 1302    SLP Stop Time  1332    SLP Time Calculation (min) 30 min    Activity Tolerance Patient tolerated treatment well                  Past Medical History: Diagnosis Date  Arthritis    COPD (chronic obstructive pulmonary disease) ([place])    Hypertension    Neck pain    Shoulder pain       History reviewed. No pertinent surgical history.   There were no vitals filed for this visit.     Subjective Assessment - 09/20/21 1707      Subjective "This has been going on a couple of years." (difficulty swallowing)    Special Tests MBSS    Currently in Pain? No/denies               ?  ?      General - 09/20/21 1707             General Information   Date of Onset 09/06/21  HPI Lj Miyamoto is a 56 yo male who was referred by Valentino Nose, FNP for MBSS due to Pt reports of liquids occasionally going back up his nose, solids not going down, and coughing on liquids. He indicates that this has been going on for a couple of years. He has an upper plate and minimal lower dentition. He says he is having surgery next month on his neck.    Type of Study MBS-Modified Barium Swallow Study    Diet Prior to this Study Regular;Thin liquids    Temperature Spikes Noted No    Respiratory Status Room air    History of Recent Intubation No    Behavior/Cognition Alert;Cooperative;Pleasant mood    Oral Cavity Assessment Within Functional Limits    Oral Care Completed by SLP No    Oral Cavity - Dentition Dentures, top;Missing dentition    Vision Functional for self feeding    Self-Feeding Abilities Able to feed self    Patient Positioning Upright in chair    Baseline Vocal Quality Normal    Volitional Cough Strong    Volitional Swallow Able to elicit    Anatomy Within functional limits    Pharyngeal Secretions Not observed secondary MBS               ?      Oral Preparation/Oral Phase - 09/20/21 1710             Oral Preparation/Oral Phase   Oral Phase  Impaired        Oral - Thin   Oral - Thin Teaspoon Within functional limits    Oral - Thin Cup Within functional limits    Oral - Thin Straw Within functional limits        Oral - Solids   Oral - Puree Within func

## 2021-10-21 ENCOUNTER — Encounter: Payer: Self-pay | Admitting: Surgery

## 2021-10-21 ENCOUNTER — Ambulatory Visit (INDEPENDENT_AMBULATORY_CARE_PROVIDER_SITE_OTHER): Payer: 59 | Admitting: Surgery

## 2021-10-21 ENCOUNTER — Ambulatory Visit: Payer: Self-pay

## 2021-10-21 VITALS — BP 122/85 | HR 93 | Ht 67.0 in | Wt 167.0 lb

## 2021-10-21 DIAGNOSIS — M4802 Spinal stenosis, cervical region: Secondary | ICD-10-CM

## 2021-11-16 ENCOUNTER — Encounter: Payer: Self-pay | Admitting: Orthopaedic Surgery

## 2021-11-16 ENCOUNTER — Ambulatory Visit (INDEPENDENT_AMBULATORY_CARE_PROVIDER_SITE_OTHER): Payer: 59 | Admitting: Orthopaedic Surgery

## 2021-11-16 ENCOUNTER — Ambulatory Visit: Payer: Self-pay

## 2021-11-16 VITALS — BP 153/76 | HR 74 | Ht 67.0 in | Wt 167.0 lb

## 2021-11-16 DIAGNOSIS — Z981 Arthrodesis status: Secondary | ICD-10-CM

## 2021-11-16 NOTE — Progress Notes (Signed)
? ?  Post-Op Visit Note ?  ?Patient: Julian Perez           ?Date of Birth: 02/20/1966           ?MRN: [id number] ?Visit Date: 11/16/2021 ?PCP: Celene Squibb, MD ? ? ?Assessment & Plan: Follow-up two-level cervical fusion patient states he has been compliant wearing his collar.  There is loosening around the inferior screws and C7 and trace motion on flexion-extension.  Needs to continue with collar return in 6 weeks for repeat AP x-ray and lateral flexion-extension x-ray.  We discussed potential need for additional surgery if he does not heal across the bone graft and it comes progressively symptomatic.  He still noticed some symptoms in his left arm which is likely related to the delayed healing. ? ?Chief Complaint:  ?Chief Complaint  ?Patient presents with  ? Neck - Follow-up  ?  10/10/2021 C5-6, C6-7 ACDF  ? ?Visit Diagnoses:  ?1. Status post cervical spinal fusion   ? ? ?Plan: Extra collar covers given.  Continue collar recheck 6 weeks x-rays as. ? ?Follow-Up Instructions: No follow-ups on file.  ? ?Orders:  ?Orders Placed This Encounter  ?Procedures  ? XR Cervical Spine 2 or 3 views  ? ?No orders of the defined types were placed in this encounter. ? ? ?Imaging: ?No results found. ? ?PMFS History: ?Patient Active Problem List  ? Diagnosis Date Noted  ? HNP (herniated nucleus pulposus), cervical 10/10/2021  ? Other spondylosis with radiculopathy, cervical region   ? Spinal stenosis of cervical region 09/21/2021  ? ?Past Medical History:  ?Diagnosis Date  ? Arthritis   ? COPD (chronic obstructive pulmonary disease) ([place])   ? Dyspnea   ? GERD (gastroesophageal reflux disease)   ? Hypertension   ? Neck pain   ? Shoulder pain   ?  ?No family history on file.  ?Past Surgical History:  ?Procedure Laterality Date  ? ANTERIOR CERVICAL DECOMP/DISCECTOMY FUSION N/A 10/10/2021  ? Procedure: CERVICAL FIVE-SIX, CERVICAL SIX-SEVEN ANTERIOR CERVICAL DECOMPRESSION/DISCECTOMY FUSION;  Surgeon: Marybelle Killings, MD;  Location: [facility];   Service: Orthopedics;  Laterality: N/A;  ? NO PAST SURGERIES    ? ?Social History  ? ?Occupational History  ? Not on file  ?Tobacco Use  ? Smoking status: Every Day  ?  Packs/day: 0.25  ?  Types: Cigarettes  ? Smokeless tobacco: Never  ?Vaping Use  ? Vaping Use: Some days  ? Substances: Flavoring  ?Substance and Sexual Activity  ? Alcohol use: Never  ? Drug use: Never  ? Sexual activity: Not on file  ? ? ? ?

## 2021-11-17 IMAGING — DX DG ANKLE COMPLETE 3+V*L*
3 series · 3 of 3 positions shown · non-contrast
Comparison: None.

CLINICAL DATA: Ankle injury.  Pain

EXAM:
LEFT ANKLE COMPLETE - 3+ VIEW

[ankle ap]
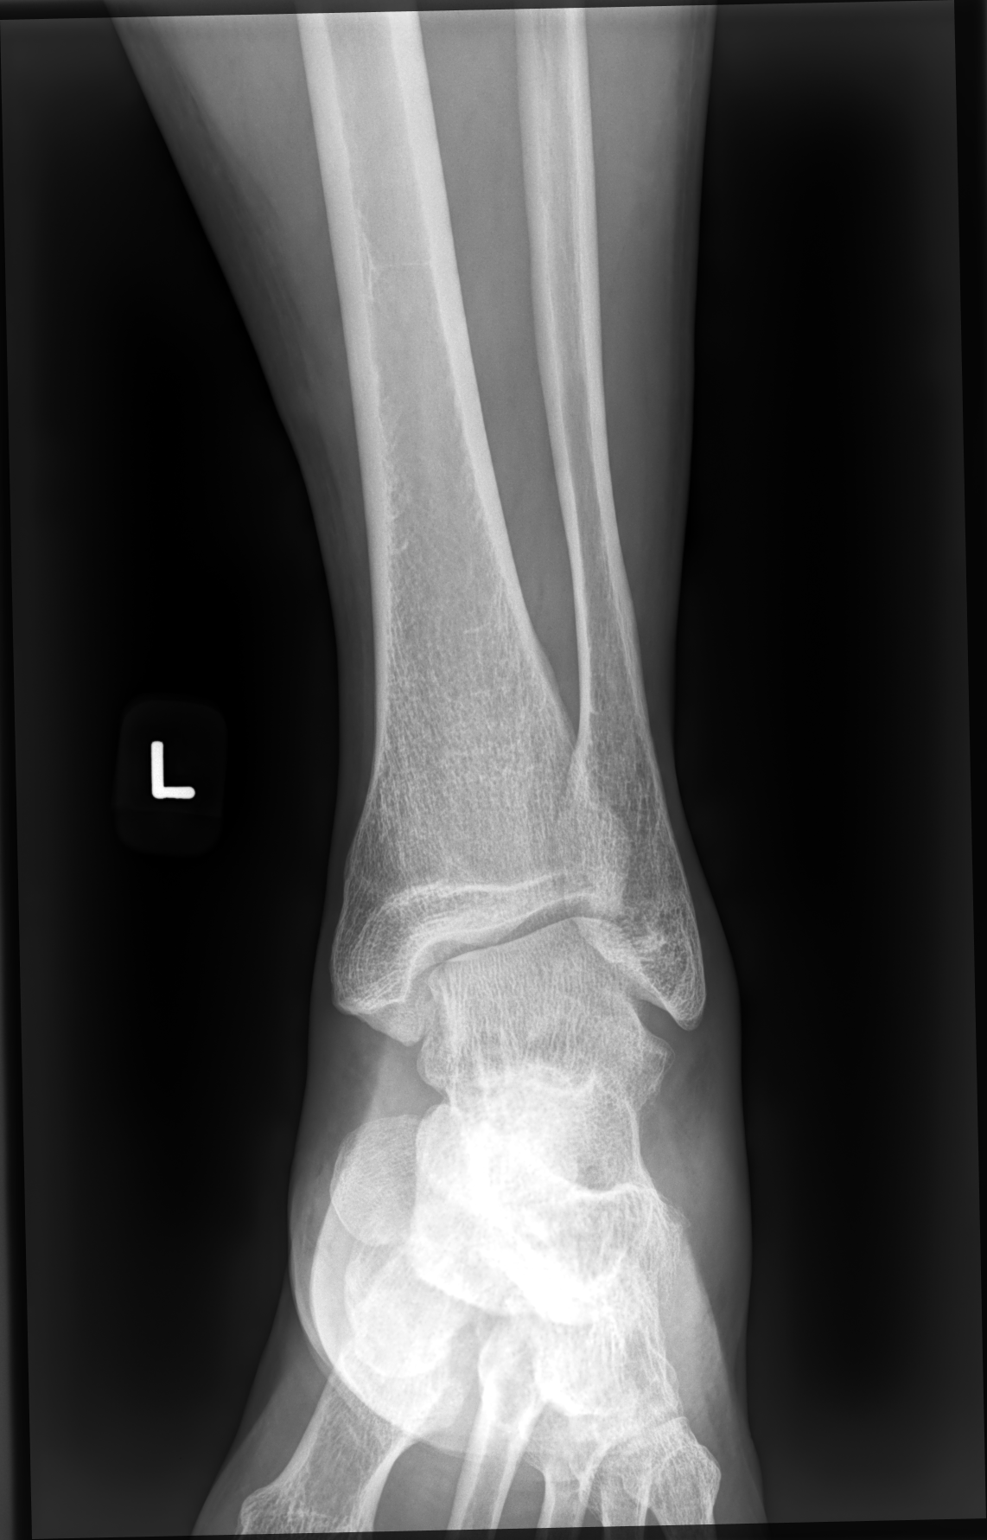

[ankle mlo]
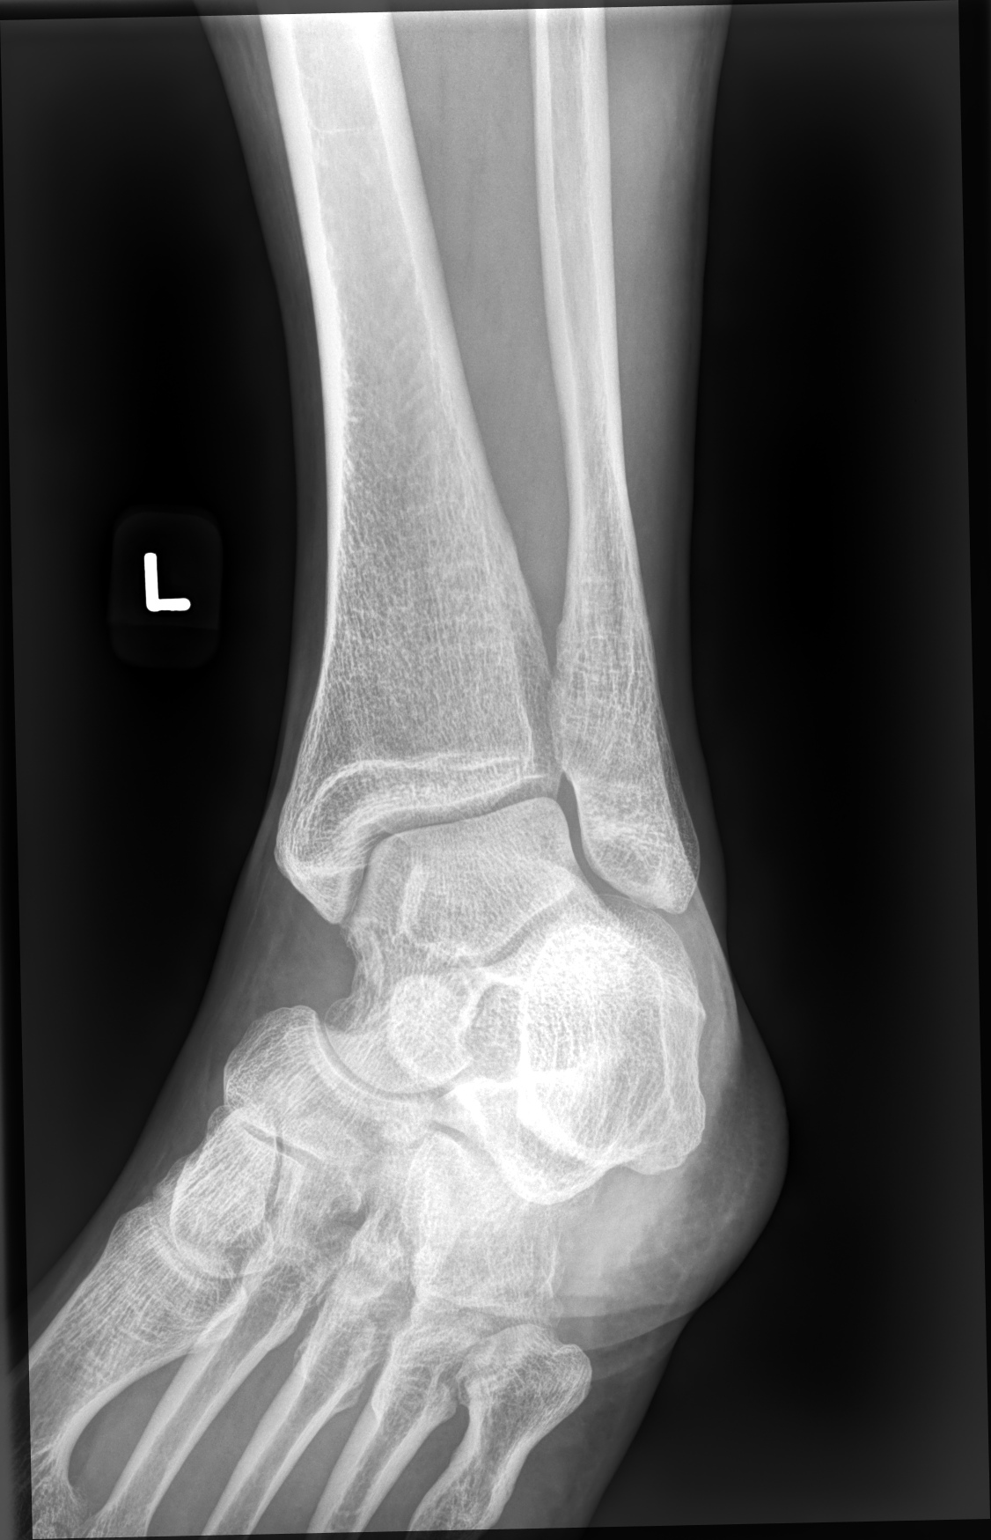

[ankle lat]
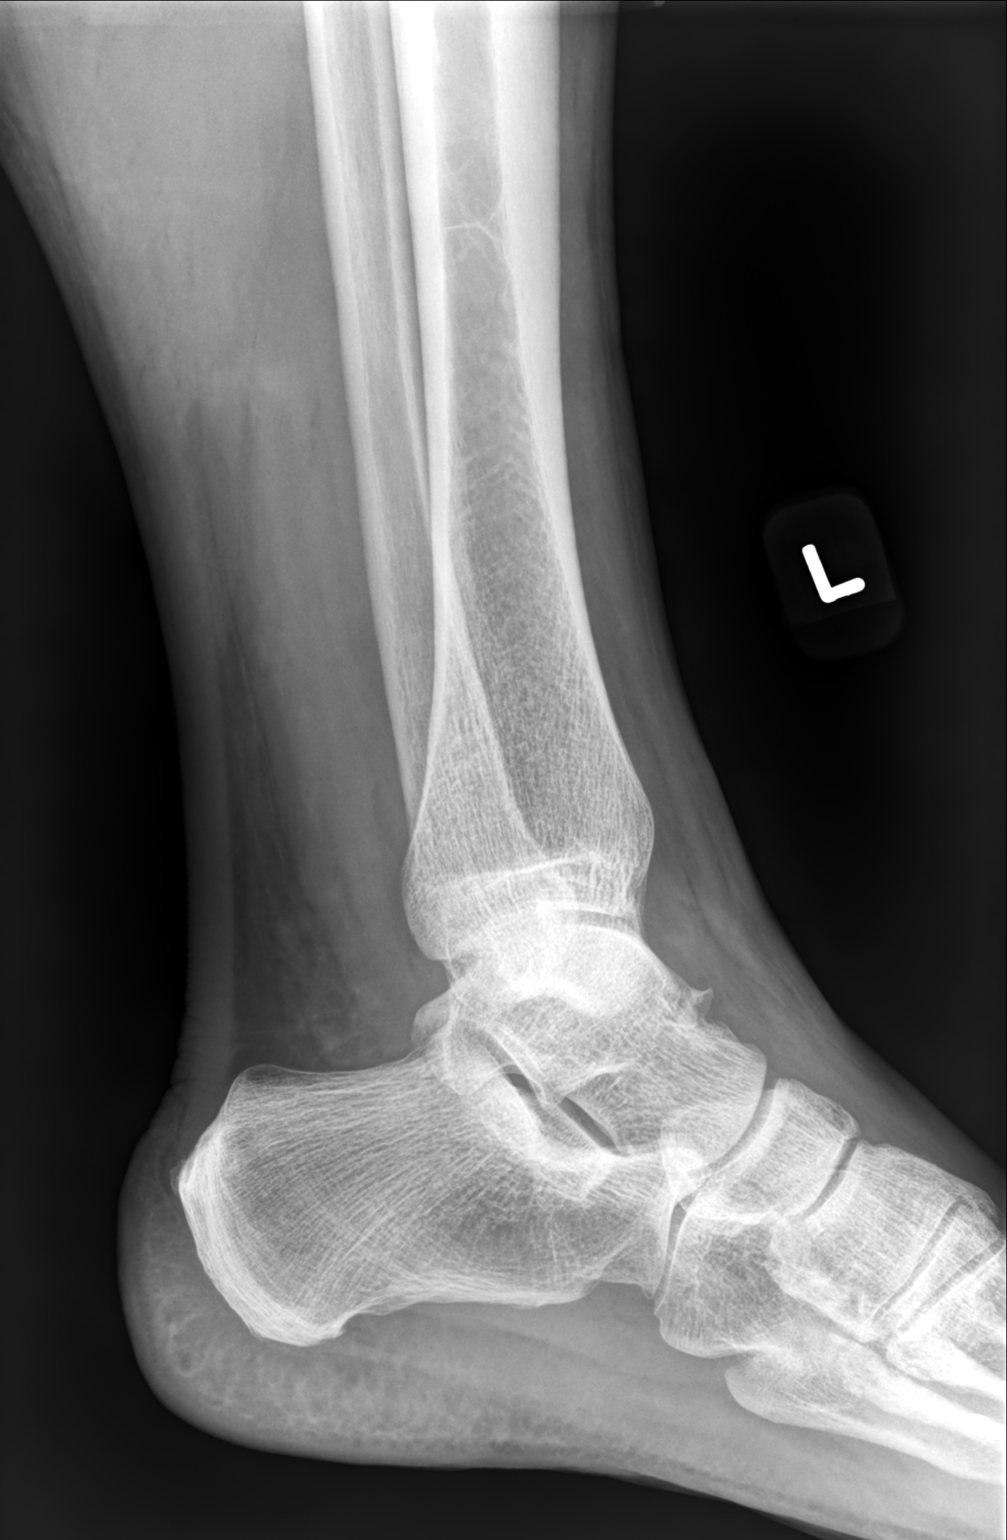

[3 of 3 positions shown; findings below may reference images not displayed]

FINDINGS: There is no evidence of fracture, dislocation, or joint effusion.
There is no evidence of arthropathy or other focal bone abnormality.
Soft tissues are unremarkable.
IMPRESSION: Negative.

## 2021-11-24 ENCOUNTER — Telehealth: Payer: Self-pay | Admitting: Orthopaedic Surgery

## 2021-11-24 NOTE — Telephone Encounter (Signed)
Patient is to continue out of work until 12/28/2021.

## 2021-11-24 NOTE — Telephone Encounter (Signed)
Pt's wife Caryl called requesting for a leave of absence letter with estimated time out of work and return date to [employer]. Please fax letter [phone] to [employer]. Please call pt 's wife when faxed to pt's employer at [phone].

## 2021-11-25 DIAGNOSIS — F418 Other specified anxiety disorders: Secondary | ICD-10-CM | POA: Insufficient documentation

## 2021-11-25 NOTE — Telephone Encounter (Signed)
Note faxed. I called patient's wife and advised.

## 2021-12-13 ENCOUNTER — Ambulatory Visit (INDEPENDENT_AMBULATORY_CARE_PROVIDER_SITE_OTHER): Payer: 59 | Admitting: Gastroenterology

## 2021-12-28 ENCOUNTER — Ambulatory Visit (INDEPENDENT_AMBULATORY_CARE_PROVIDER_SITE_OTHER): Payer: 59

## 2021-12-28 ENCOUNTER — Ambulatory Visit (INDEPENDENT_AMBULATORY_CARE_PROVIDER_SITE_OTHER): Payer: 59 | Admitting: Orthopaedic Surgery

## 2021-12-28 ENCOUNTER — Encounter: Payer: Self-pay | Admitting: Orthopaedic Surgery

## 2021-12-28 VITALS — BP 130/84 | HR 91 | Ht 67.0 in | Wt 167.0 lb

## 2021-12-28 DIAGNOSIS — Z981 Arthrodesis status: Secondary | ICD-10-CM | POA: Diagnosis not present

## 2021-12-28 NOTE — Progress Notes (Signed)
   Post-Op Visit Note   Patient: Julian Perez           Date of Birth: 1966/02/12           MRN: [id number] Visit Date: 12/28/2021 PCP: Celene Squibb, MD   Assessment & Plan: Patient returns he still been having some headaches.  Flexion-extension x-rays show no motion and progressive graft incorporation.  He can discontinue collar.  Return in 2 months.  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  Return in 2 months we will obtain 2 view x-rays cervical spine on return.  Chief Complaint:  Chief Complaint  Patient presents with   Neck - Routine Post Op   Visit Diagnoses:  1. Status post cervical spinal fusion     Plan:   Follow-Up Instructions: Return in about 2 months (around 02/27/2022).   Orders:  Orders Placed This Encounter  Procedures   XR Cervical Spine 2 or 3 views   No orders of the defined types were placed in this encounter.   Imaging: No results found.  PMFS History: Patient Active Problem List   Diagnosis Date Noted   HNP (herniated nucleus pulposus), cervical 10/10/2021   Other spondylosis with radiculopathy, cervical region    Spinal stenosis of cervical region 09/21/2021   Past Medical History:  Diagnosis Date   Arthritis    COPD (chronic obstructive pulmonary disease) ([facility])    Dyspnea    GERD (gastroesophageal reflux disease)    Hypertension    Neck pain    Shoulder pain     History reviewed. No pertinent family history.  Past Surgical History:  Procedure Laterality Date   ANTERIOR CERVICAL DECOMP/DISCECTOMY FUSION N/A 10/10/2021   Procedure: CERVICAL FIVE-SIX, CERVICAL SIX-SEVEN ANTERIOR CERVICAL DECOMPRESSION/DISCECTOMY FUSION;  Surgeon: Marybelle Killings, MD;  Location: [facility];  Service: Orthopedics;  Laterality: N/A;   NO PAST SURGERIES     Social History   Occupational History   Not on file  Tobacco Use   Smoking status: Every Day     Packs/day: 0.25    Types: Cigarettes   Smokeless tobacco: Never  Vaping Use   Vaping Use: Some days   Substances: Flavoring  Substance and Sexual Activity   Alcohol use: Never   Drug use: Never   Sexual activity: Not on file

## 2022-01-10 DIAGNOSIS — I700 Atherosclerosis of aorta: Secondary | ICD-10-CM | POA: Diagnosis not present

## 2022-01-10 DIAGNOSIS — K8020 Calculus of gallbladder without cholecystitis without obstruction: Secondary | ICD-10-CM | POA: Diagnosis not present

## 2022-01-10 DIAGNOSIS — R10816 Epigastric abdominal tenderness: Secondary | ICD-10-CM | POA: Diagnosis not present

## 2022-01-10 DIAGNOSIS — N3000 Acute cystitis without hematuria: Secondary | ICD-10-CM | POA: Diagnosis not present

## 2022-01-10 DIAGNOSIS — K5730 Diverticulosis of large intestine without perforation or abscess without bleeding: Secondary | ICD-10-CM | POA: Diagnosis not present

## 2022-01-10 DIAGNOSIS — R1013 Epigastric pain: Secondary | ICD-10-CM | POA: Diagnosis not present

## 2022-01-10 DIAGNOSIS — R110 Nausea: Secondary | ICD-10-CM | POA: Diagnosis not present

## 2022-01-10 DIAGNOSIS — R10812 Left upper quadrant abdominal tenderness: Secondary | ICD-10-CM | POA: Diagnosis not present

## 2022-01-10 DIAGNOSIS — R1011 Right upper quadrant pain: Secondary | ICD-10-CM | POA: Diagnosis not present

## 2022-01-10 DIAGNOSIS — R1012 Left upper quadrant pain: Secondary | ICD-10-CM | POA: Diagnosis not present

## 2022-01-10 DIAGNOSIS — K760 Fatty (change of) liver, not elsewhere classified: Secondary | ICD-10-CM | POA: Diagnosis not present

## 2022-01-10 DIAGNOSIS — R10811 Right upper quadrant abdominal tenderness: Secondary | ICD-10-CM | POA: Diagnosis not present

## 2022-01-13 DIAGNOSIS — M542 Cervicalgia: Secondary | ICD-10-CM | POA: Diagnosis not present

## 2022-01-13 DIAGNOSIS — Z79891 Long term (current) use of opiate analgesic: Secondary | ICD-10-CM | POA: Diagnosis not present

## 2022-01-13 DIAGNOSIS — M25519 Pain in unspecified shoulder: Secondary | ICD-10-CM | POA: Diagnosis not present

## 2022-01-13 DIAGNOSIS — M25511 Pain in right shoulder: Secondary | ICD-10-CM | POA: Diagnosis not present

## 2022-01-13 DIAGNOSIS — M1990 Unspecified osteoarthritis, unspecified site: Secondary | ICD-10-CM | POA: Diagnosis not present

## 2022-01-13 DIAGNOSIS — M5412 Radiculopathy, cervical region: Secondary | ICD-10-CM | POA: Diagnosis not present

## 2022-01-18 DIAGNOSIS — L989 Disorder of the skin and subcutaneous tissue, unspecified: Secondary | ICD-10-CM | POA: Diagnosis not present

## 2022-01-18 DIAGNOSIS — G894 Chronic pain syndrome: Secondary | ICD-10-CM | POA: Diagnosis not present

## 2022-01-18 DIAGNOSIS — G253 Myoclonus: Secondary | ICD-10-CM | POA: Insufficient documentation

## 2022-02-10 DIAGNOSIS — M1990 Unspecified osteoarthritis, unspecified site: Secondary | ICD-10-CM | POA: Diagnosis not present

## 2022-02-10 DIAGNOSIS — Z79891 Long term (current) use of opiate analgesic: Secondary | ICD-10-CM | POA: Diagnosis not present

## 2022-02-10 DIAGNOSIS — M25519 Pain in unspecified shoulder: Secondary | ICD-10-CM | POA: Diagnosis not present

## 2022-02-10 DIAGNOSIS — M542 Cervicalgia: Secondary | ICD-10-CM | POA: Diagnosis not present

## 2022-02-10 DIAGNOSIS — M5412 Radiculopathy, cervical region: Secondary | ICD-10-CM | POA: Diagnosis not present

## 2022-02-10 DIAGNOSIS — M25511 Pain in right shoulder: Secondary | ICD-10-CM | POA: Diagnosis not present

## 2022-02-13 ENCOUNTER — Ambulatory Visit (INDEPENDENT_AMBULATORY_CARE_PROVIDER_SITE_OTHER): Payer: 59 | Admitting: Gastroenterology

## 2022-02-14 ENCOUNTER — Encounter (INDEPENDENT_AMBULATORY_CARE_PROVIDER_SITE_OTHER): Payer: Self-pay | Admitting: Gastroenterology

## 2022-02-14 ENCOUNTER — Ambulatory Visit (INDEPENDENT_AMBULATORY_CARE_PROVIDER_SITE_OTHER): Payer: 59 | Admitting: Gastroenterology

## 2022-02-14 VITALS — BP 131/76 | HR 91 | Temp 98.2°F | Ht 66.0 in | Wt 175.9 lb

## 2022-02-14 DIAGNOSIS — R1310 Dysphagia, unspecified: Secondary | ICD-10-CM | POA: Diagnosis not present

## 2022-02-14 DIAGNOSIS — R140 Abdominal distension (gaseous): Secondary | ICD-10-CM | POA: Diagnosis not present

## 2022-02-14 DIAGNOSIS — Z1211 Encounter for screening for malignant neoplasm of colon: Secondary | ICD-10-CM

## 2022-02-14 NOTE — Patient Instructions (Signed)
We will get you scheduled for EGD and colonoscopy Continue your omeprazole '40mg'$  once daily  Avoid greasy, spicy, fried, citrus foods, and be mindful that caffeine, carbonated drinks, chocolate and alcohol can increase reflux symptoms Stay upright 2-3 hours after eating, prior to lying down and avoid eating late in the evenings.  You can try over the counter phazyme or gas x for bloating  I have also provided the low FODMAP diet as this can be beneficial for patients with IBS  Follow up 3 months

## 2022-02-14 NOTE — Progress Notes (Signed)
Referring Provider: Celene Squibb, MD Primary Care Physician:  Celene Squibb, MD Primary GI Physician: new  Chief Complaint  Patient presents with   Dysphagia    Patient here today with complaints of issues with swallowing. Patient states on going for a year and worsening.    HPI:   Julian Perez is a 56 y.o. male with past medical history of arthritis, COPD, GERD, HTN.   Patient presenting today for dysphagia and to set up colonoscopy  Barium swallow study in March 2023 with laryngeal penetration without aspiration, no mass or obstruction.  Swallow eval with SLP also in march 2023 with  noted oropharyngeal swallowing WNL, advised to keep swallowing journal when he had trouble swallowing.   Notably with cervical spine surgery in April.  Labs in July 2023 with normal CBC, lipase 196 (CT at that time neg for pancreatitis), LFTs normal other than alk phos 118, notably alk phos normal in early April 2023 at 5.   Present:  Patient reports dysphagia for the past year, sometimes occurs with swallowing saliva. Sometimes foods will get stuck near the sternal notch, he can usually relax and food will slowly eventually pass down. Has had no episodes where he needed to cough foods back up. Sometimes liquids also cause issues. He is maintained on omeprazole 71m, having both heartburn and acid regurgitation maybe every 3 days. Does not depend on what he eats. He does not take anything for his symptoms. Sometimes has pain with swallowing. He does endorse some abdominal pain, mostly in upper abdomen with some bloating. Diagnosed with gallstones at that time. Denies nausea, vomiting, changes in appetite or weight loss. No rectal bleeding or melena.    Concerned about presence of IBS, he has a lot of bloating, sometimes can have up to 4 BMs in the morning after waking up. This has been ongoing for about 6 months. Stools are usually more formed, rare diarrhea.   NSAID use: no  Social hx: no etoh, smokes  1/2 PPD Fam hZO:XWRUEAof family hx   CT A/P with contrast: 01/10/22 No acute findings are noted in the abdomen or pelvis to account  for the patient's symptoms.  2. Specifically, no imaging findings of complicated pancreatitis are  noted at this time.  3. Hepatic steatosis.  4. Cholelithiasis without evidence of acute cholecystitis at this  time.  5. Colonic diverticulosis without evidence of acute diverticulitis.  6. Aortic atherosclerosis.  Last Colonoscopy:never, had had cologuard previously  Last Endoscopy:many years ago, for dysphagia unsure of findings  Recommendations:    Past Medical History:  Diagnosis Date   Arthritis    COPD (chronic obstructive pulmonary disease) ([facility])    Dyspnea    GERD (gastroesophageal reflux disease)    Hypertension    Neck pain    Shoulder pain     Past Surgical History:  Procedure Laterality Date   ANTERIOR CERVICAL DECOMP/DISCECTOMY FUSION N/A 10/10/2021   Procedure: CERVICAL FIVE-SIX, CERVICAL SIX-SEVEN ANTERIOR CERVICAL DECOMPRESSION/DISCECTOMY FUSION;  Surgeon: YMarybelle Killings MD;  [facility]  Service: Orthopedics;  Laterality: N/A;   NO PAST SURGERIES      Current Outpatient Medications  Medication Sig Dispense Refill   albuterol (PROVENTIL) (2.5 MG/3ML) 0.083% nebulizer solution Take 2.5 mg by nebulization every 6 (six) hours as needed for wheezing or shortness of breath.     albuterol (VENTOLIN HFA) 108 (90 Base) MCG/ACT inhaler Inhale 1-2 puffs into the lungs every 6 (six) hours as needed for wheezing  or shortness of breath.     atorvastatin (LIPITOR) 10 MG tablet Take 10 mg by mouth at bedtime.     DULoxetine (CYMBALTA) 60 MG capsule Take 60 mg by mouth daily.     gabapentin (NEURONTIN) 800 MG tablet Take 800 mg by mouth 4 (four) times daily.     methocarbamol (ROBAXIN) 750 MG tablet Take 750 mg by mouth 3 (three) times daily.     omeprazole (PRILOSEC) 40 MG capsule Take 40 mg by mouth daily.     oxyCODONE-acetaminophen  (PERCOCET) 10-325 MG tablet Take 1 tablet by mouth every 6 (six) hours as needed for pain.     TRELEGY ELLIPTA 100-62.5-25 MCG/ACT AEPB Take 1 puff by mouth daily.     No current facility-administered medications for this visit.    Allergies as of 02/14/2022 - Review Complete 02/14/2022  Allergen Reaction Noted   Penicillins Other (See Comments) 12/06/2017    History reviewed. No pertinent family history.  Social History   Socioeconomic History   Marital status: Married    Spouse name: Not on file   Number of children: Not on file   Years of education: Not on file   Highest education level: Not on file  Occupational History   Not on file  Tobacco Use   Smoking status: Every Day    Packs/day: 0.25    Types: Cigarettes   Smokeless tobacco: Never  Vaping Use   Vaping Use: Former   Substances: Flavoring  Substance and Sexual Activity   Alcohol use: Never   Drug use: Never   Sexual activity: Not on file  Other Topics Concern   Not on file  Social History Narrative   Not on file   Social Determinants of Health   Financial Resource Strain: Not on file  Food Insecurity: Not on file  Transportation Needs: Not on file  Physical Activity: Not on file  Stress: Not on file  Social Connections: Not on file   Review of systems General: negative for malaise, night sweats, fever, chills, weight loss Neck: Negative for lumps, goiter, pain and significant neck swelling Resp: Negative for cough, wheezing, dyspnea at rest CV: Negative for chest pain, leg swelling, palpitations, orthopnea GI: denies melena, hematochezia, nausea, vomiting, diarrhea, constipation, early satiety or unintentional weight loss. +dysphagia +bloating  MSK: Negative for joint pain or swelling, back pain, and muscle pain. Derm: Negative for itching or rash Psych: Denies depression, anxiety, memory loss, confusion. No homicidal or suicidal ideation.  Heme: Negative for prolonged bleeding, bruising easily,  and swollen nodes. Endocrine: Negative for cold or heat intolerance, polyuria, polydipsia and goiter. Neuro: negative for tremor, gait imbalance, syncope and seizures. The remainder of the review of systems is noncontributory.  Physical Exam: BP 131/76 (BP Location: Left Arm, Patient Position: Sitting, Cuff Size: Large)   Pulse 91   Temp 98.2 F (36.8 C) (Oral)   Ht _0  (1.676 m)   Wt 175 lb 14.4 oz (79.8 kg)   BMI 28.39 kg/m  General:   Alert and oriented. No distress noted. Pleasant and cooperative.  Head:  Normocephalic and atraumatic. Eyes:  Conjuctiva clear without scleral icterus. Mouth:  Oral mucosa pink and moist. Good dentition. No lesions. Heart: Normal rate and rhythm, s1 and s2 heart sounds present.  Lungs: Clear lung sounds in all lobes. Respirations equal and unlabored. Abdomen:  +BS, soft, and non-distended. Mild TTP epigastric area. No rebound or guarding. No HSM or masses noted. Derm: No palmar erythema or jaundice  Msk:  Symmetrical without gross deformities. Normal posture. Extremities:  Without edema. Neurologic:  Alert and  oriented x4 Psych:  Alert and cooperative. Normal mood and affect.  Invalid input(s): "6 MONTHS"   ASSESSMENT: Julian Perez is a 56 y.o. male presenting today as a new patient for dysphagia and to schedule CRC screening via colonoscopy.   Dysphagia for the past year, occurring even sometimes with saliva, with recent barium swallow and SLP evaluation that were mostly unremarkable. Recommend proceeding with EGD as we cannot rule out esophageal stenosis, stricture, ring, web or less likely malignancy. Currently on omeprazole 76m daily with some breakthrough reflux symptoms, pt prefers to continue with current med dose for now, further recommendations for BID dosing or addition of H2B to be determined after EGD. Should continue with reflux precautions.   Notably has never had a colonoscopy, had negative cologuard maybe 1.5 years ago. Discussed  indications for CRC via colonoscopy with the patient and significant other and recommended proceeding with initial screening TCS at time of EGD, patient amenable.   Patient does note some bloating, sometimes with frequent BMs, though stools are solid, no diarrhea, potentially some underlying IBS. Discussed implementing low FODMAP diet to see if symptoms seem to correlate with food intake. Can try otc phazyme or gas x.   Indications, risks and benefits of procedure discussed in detail with patient. Patient verbalized understanding and is in agreement to proceed with EGD/colonoscopy at this time.    PLAN:  Schedule EGD and Colonoscopy ASA III 2.  Continue omeprazole 459mdaily for now, further recs after EGD for increase to BID dosing or addition of H2B 3. Reflux precautions  4. Low FODMAp diet 5. Otc phazyme/ gas x   All questions were answered, patient verbalized understanding and is in agreement with plan as outlined above.    Follow Up: 3 months   Hubert Raatz L. CaAlver SorrowMSN, APRN, AGNP-C Adult-Gerontology Nurse Practitioner [facility]

## 2022-02-14 NOTE — H&P (View-Only) (Signed)
Referring Provider: Celene Squibb, MD Primary Care Physician:  Celene Squibb, MD Primary GI Physician: new  Chief Complaint  Patient presents with   Dysphagia    Patient here today with complaints of issues with swallowing. Patient states on going for a year and worsening.    HPI:   Julian Perez is a 56 y.o. male with past medical history of arthritis, COPD, GERD, HTN.   Patient presenting today for dysphagia and to set up colonoscopy  Barium swallow study in March 2023 with laryngeal penetration without aspiration, no mass or obstruction.  Swallow eval with SLP also in march 2023 with  noted oropharyngeal swallowing WNL, advised to keep swallowing journal when he had trouble swallowing.   Notably with cervical spine surgery in April.  Labs in July 2023 with normal CBC, lipase 196 (CT at that time neg for pancreatitis), LFTs normal other than alk phos 118, notably alk phos normal in early April 2023 at 5.   Present:  Patient reports dysphagia for the past year, sometimes occurs with swallowing saliva. Sometimes foods will get stuck near the sternal notch, he can usually relax and food will slowly eventually pass down. Has had no episodes where he needed to cough foods back up. Sometimes liquids also cause issues. He is maintained on omeprazole 71m, having both heartburn and acid regurgitation maybe every 3 days. Does not depend on what he eats. He does not take anything for his symptoms. Sometimes has pain with swallowing. He does endorse some abdominal pain, mostly in upper abdomen with some bloating. Diagnosed with gallstones at that time. Denies nausea, vomiting, changes in appetite or weight loss. No rectal bleeding or melena.    Concerned about presence of IBS, he has a lot of bloating, sometimes can have up to 4 BMs in the morning after waking up. This has been ongoing for about 6 months. Stools are usually more formed, rare diarrhea.   NSAID use: no  Social hx: no etoh, smokes  1/2 PPD Fam hZO:XWRUEAof family hx   CT A/P with contrast: 01/10/22 No acute findings are noted in the abdomen or pelvis to account  for the patient's symptoms.  2. Specifically, no imaging findings of complicated pancreatitis are  noted at this time.  3. Hepatic steatosis.  4. Cholelithiasis without evidence of acute cholecystitis at this  time.  5. Colonic diverticulosis without evidence of acute diverticulitis.  6. Aortic atherosclerosis.  Last Colonoscopy:never, had had cologuard previously  Last Endoscopy:many years ago, for dysphagia unsure of findings  Recommendations:    Past Medical History:  Diagnosis Date   Arthritis    COPD (chronic obstructive pulmonary disease) ([facility])    Dyspnea    GERD (gastroesophageal reflux disease)    Hypertension    Neck pain    Shoulder pain     Past Surgical History:  Procedure Laterality Date   ANTERIOR CERVICAL DECOMP/DISCECTOMY FUSION N/A 10/10/2021   Procedure: CERVICAL FIVE-SIX, CERVICAL SIX-SEVEN ANTERIOR CERVICAL DECOMPRESSION/DISCECTOMY FUSION;  Surgeon: YMarybelle Killings MD;  [facility]  Service: Orthopedics;  Laterality: N/A;   NO PAST SURGERIES      Current Outpatient Medications  Medication Sig Dispense Refill   albuterol (PROVENTIL) (2.5 MG/3ML) 0.083% nebulizer solution Take 2.5 mg by nebulization every 6 (six) hours as needed for wheezing or shortness of breath.     albuterol (VENTOLIN HFA) 108 (90 Base) MCG/ACT inhaler Inhale 1-2 puffs into the lungs every 6 (six) hours as needed for wheezing  or shortness of breath.     atorvastatin (LIPITOR) 10 MG tablet Take 10 mg by mouth at bedtime.     DULoxetine (CYMBALTA) 60 MG capsule Take 60 mg by mouth daily.     gabapentin (NEURONTIN) 800 MG tablet Take 800 mg by mouth 4 (four) times daily.     methocarbamol (ROBAXIN) 750 MG tablet Take 750 mg by mouth 3 (three) times daily.     omeprazole (PRILOSEC) 40 MG capsule Take 40 mg by mouth daily.     oxyCODONE-acetaminophen  (PERCOCET) 10-325 MG tablet Take 1 tablet by mouth every 6 (six) hours as needed for pain.     TRELEGY ELLIPTA 100-62.5-25 MCG/ACT AEPB Take 1 puff by mouth daily.     No current facility-administered medications for this visit.    Allergies as of 02/14/2022 - Review Complete 02/14/2022  Allergen Reaction Noted   Penicillins Other (See Comments) 12/06/2017    History reviewed. No pertinent family history.  Social History   Socioeconomic History   Marital status: Married    Spouse name: Not on file   Number of children: Not on file   Years of education: Not on file   Highest education level: Not on file  Occupational History   Not on file  Tobacco Use   Smoking status: Every Day    Packs/day: 0.25    Types: Cigarettes   Smokeless tobacco: Never  Vaping Use   Vaping Use: Former   Substances: Flavoring  Substance and Sexual Activity   Alcohol use: Never   Drug use: Never   Sexual activity: Not on file  Other Topics Concern   Not on file  Social History Narrative   Not on file   Social Determinants of Health   Financial Resource Strain: Not on file  Food Insecurity: Not on file  Transportation Needs: Not on file  Physical Activity: Not on file  Stress: Not on file  Social Connections: Not on file   Review of systems General: negative for malaise, night sweats, fever, chills, weight loss Neck: Negative for lumps, goiter, pain and significant neck swelling Resp: Negative for cough, wheezing, dyspnea at rest CV: Negative for chest pain, leg swelling, palpitations, orthopnea GI: denies melena, hematochezia, nausea, vomiting, diarrhea, constipation, early satiety or unintentional weight loss. +dysphagia +bloating  MSK: Negative for joint pain or swelling, back pain, and muscle pain. Derm: Negative for itching or rash Psych: Denies depression, anxiety, memory loss, confusion. No homicidal or suicidal ideation.  Heme: Negative for prolonged bleeding, bruising easily,  and swollen nodes. Endocrine: Negative for cold or heat intolerance, polyuria, polydipsia and goiter. Neuro: negative for tremor, gait imbalance, syncope and seizures. The remainder of the review of systems is noncontributory.  Physical Exam: BP 131/76 (BP Location: Left Arm, Patient Position: Sitting, Cuff Size: Large)   Pulse 91   Temp 98.2 F (36.8 C) (Oral)   Ht 5' 6" (1.676 m)   Wt 175 lb 14.4 oz (79.8 kg)   BMI 28.39 kg/m  General:   Alert and oriented. No distress noted. Pleasant and cooperative.  Head:  Normocephalic and atraumatic. Eyes:  Conjuctiva clear without scleral icterus. Mouth:  Oral mucosa pink and moist. Good dentition. No lesions. Heart: Normal rate and rhythm, s1 and s2 heart sounds present.  Lungs: Clear lung sounds in all lobes. Respirations equal and unlabored. Abdomen:  +BS, soft, and non-distended. Mild TTP epigastric area. No rebound or guarding. No HSM or masses noted. Derm: No palmar erythema or jaundice   Msk:  Symmetrical without gross deformities. Normal posture. Extremities:  Without edema. Neurologic:  Alert and  oriented x4 Psych:  Alert and cooperative. Normal mood and affect.  Invalid input(s): "6 MONTHS"   ASSESSMENT: Julian Perez is a 56 y.o. male presenting today as a new patient for dysphagia and to schedule CRC screening via colonoscopy.   Dysphagia for the past year, occurring even sometimes with saliva, with recent barium swallow and SLP evaluation that were mostly unremarkable. Recommend proceeding with EGD as we cannot rule out esophageal stenosis, stricture, ring, web or less likely malignancy. Currently on omeprazole 76m daily with some breakthrough reflux symptoms, pt prefers to continue with current med dose for now, further recommendations for BID dosing or addition of H2B to be determined after EGD. Should continue with reflux precautions.   Notably has never had a colonoscopy, had negative cologuard maybe 1.5 years ago. Discussed  indications for CRC via colonoscopy with the patient and significant other and recommended proceeding with initial screening TCS at time of EGD, patient amenable.   Patient does note some bloating, sometimes with frequent BMs, though stools are solid, no diarrhea, potentially some underlying IBS. Discussed implementing low FODMAP diet to see if symptoms seem to correlate with food intake. Can try otc phazyme or gas x.   Indications, risks and benefits of procedure discussed in detail with patient. Patient verbalized understanding and is in agreement to proceed with EGD/colonoscopy at this time.    PLAN:  Schedule EGD and Colonoscopy ASA III 2.  Continue omeprazole 459mdaily for now, further recs after EGD for increase to BID dosing or addition of H2B 3. Reflux precautions  4. Low FODMAp diet 5. Otc phazyme/ gas x   All questions were answered, patient verbalized understanding and is in agreement with plan as outlined above.    Follow Up: 3 months   Odai Wimmer L. CaAlver SorrowMSN, APRN, AGNP-C Adult-Gerontology Nurse Practitioner [facility]

## 2022-02-22 ENCOUNTER — Other Ambulatory Visit (INDEPENDENT_AMBULATORY_CARE_PROVIDER_SITE_OTHER): Payer: Self-pay

## 2022-02-22 ENCOUNTER — Encounter (INDEPENDENT_AMBULATORY_CARE_PROVIDER_SITE_OTHER): Payer: Self-pay

## 2022-02-22 ENCOUNTER — Telehealth (INDEPENDENT_AMBULATORY_CARE_PROVIDER_SITE_OTHER): Payer: Self-pay

## 2022-02-22 DIAGNOSIS — Z1211 Encounter for screening for malignant neoplasm of colon: Secondary | ICD-10-CM

## 2022-02-22 DIAGNOSIS — R1310 Dysphagia, unspecified: Secondary | ICD-10-CM

## 2022-02-22 MED ORDER — PEG 3350-KCL-NA BICARB-NACL 420 G PO SOLR: 4000.0000 mL | ORAL | 0 refills | Status: DC

## 2022-02-22 NOTE — Telephone Encounter (Signed)
Julian Perez, CMA  ?

## 2022-02-23 ENCOUNTER — Encounter (INDEPENDENT_AMBULATORY_CARE_PROVIDER_SITE_OTHER): Payer: Self-pay

## 2022-02-24 ENCOUNTER — Other Ambulatory Visit (HOSPITAL_COMMUNITY): Payer: Self-pay | Admitting: Neurology

## 2022-02-24 ENCOUNTER — Other Ambulatory Visit: Payer: Self-pay | Admitting: Neurology

## 2022-02-24 DIAGNOSIS — I639 Cerebral infarction, unspecified: Secondary | ICD-10-CM

## 2022-02-24 DIAGNOSIS — G8194 Hemiplegia, unspecified affecting left nondominant side: Secondary | ICD-10-CM

## 2022-02-28 ENCOUNTER — Encounter: Payer: 59 | Admitting: Orthopaedic Surgery

## 2022-03-01 ENCOUNTER — Encounter: Payer: 59 | Admitting: Orthopaedic Surgery

## 2022-03-07 ENCOUNTER — Ambulatory Visit (INDEPENDENT_AMBULATORY_CARE_PROVIDER_SITE_OTHER): Payer: 59

## 2022-03-07 ENCOUNTER — Encounter: Payer: Self-pay | Admitting: Orthopaedic Surgery

## 2022-03-07 ENCOUNTER — Ambulatory Visit (INDEPENDENT_AMBULATORY_CARE_PROVIDER_SITE_OTHER): Payer: 59 | Admitting: Orthopaedic Surgery

## 2022-03-07 VITALS — BP 132/75 | HR 82 | Ht 66.0 in | Wt 175.0 lb

## 2022-03-07 DIAGNOSIS — Z981 Arthrodesis status: Secondary | ICD-10-CM

## 2022-03-07 NOTE — Progress Notes (Signed)
Office Visit Note   Patient: Julian Perez           Date of Birth: Aug 31, 1965           MRN: [id number] Visit Date: 03/07/2022              Requested by: Celene Squibb, MD [place],  [place] [address] PCP: Celene Squibb, MD   Assessment & Plan: Visit Diagnoses:  1. Status post cervical spinal fusion     Plan: We discussed patient gradually increasing his activity level starting with some light workout activities and then progressing.  We discussed pain management medications he had been on which predates his surgery for cervical spinal stenosis which has been corrected.  We reviewed x-rays with patient and family which shows good graft incorporation he can follow-up on an as-needed basis.  Follow-Up Instructions: No follow-ups on file.   Orders:  Orders Placed This Encounter  Procedures   XR Cervical Spine 2 or 3 views   No orders of the defined types were placed in this encounter.     Procedures: No procedures performed   Clinical Data: No additional findings.   Subjective: Chief Complaint  Patient presents with   Neck - Follow-up, Routine Post Op    10/10/2021 C5-6, C6-7 ACDF    HPI 56 year old male returns for follow-up post two-level cervical fusion done 10/10/21 and had repeat images done today.  States he still notes his neck cracks and pops with rotation at times.  He is ambulating with a cane.  He states he has had problems with headaches.  He had been seen in March for dysphagia problems and this showed some oral phase dysphagia with limited lower dentition and upper plate resulting in mildly prolonged oral transit of regular texture and secondary swallowing to clear minimal oral residuals per is swallowing study done 09/20/2021.    Study showed severe spinal stenosis at C5-6 and C6-7 which with operative levels.  He only had mild foraminal narrowing at other levels that were not surgically treated.  Patient been on chronic pain management  getting refills by nurse practitioner times greater than a year currently on oxycodone/acetaminophen 10/325 with his last refill on 02/24/2022 being 105 tablets.  Additionally he is on Neurontin 800 mg 120 tablets last refill 03/02/2022.  Review of Systems updated.  No changes relates to HPI.   Objective: Vital Signs: BP 132/75   Pulse 82   Ht '5\' 6"'$  (1.676 m)   Wt 175 lb (79.4 kg)   BMI 28.25 kg/m   Physical Exam Constitutional:      Appearance: He is well-developed.  HENT:     Head: Normocephalic and atraumatic.     Right Ear: External ear normal.     Left Ear: External ear normal.  Eyes:     Pupils: Pupils are equal, round, and reactive to light.  Neck:     Thyroid: No thyromegaly.     Trachea: No tracheal deviation.  Cardiovascular:     Rate and Rhythm: Normal rate.  Pulmonary:     Effort: Pulmonary effort is normal.     Breath sounds: No wheezing.  Abdominal:     General: Bowel sounds are normal.     Palpations: Abdomen is soft.  Musculoskeletal:     Cervical back: Neck supple.  Skin:    General: Skin is warm and dry.     Capillary Refill: Capillary refill takes less than 2 seconds.  Neurological:  Mental Status: He is alert and oriented to person, place, and time.  Psychiatric:        Behavior: Behavior normal.        Thought Content: Thought content normal.        Judgment: Judgment normal.     Ortho Exam well-healed cervical incision.  Patient is ambulating with a cane.  No atrophy of the upper extremities.  Specialty Comments:  No specialty comments available.  Imaging: No results found.   PMFS History: Patient Active Problem List   Diagnosis Date Noted   Dysphagia 02/14/2022   Abdominal bloating 02/14/2022   Encounter for screening colonoscopy 02/14/2022   HNP (herniated nucleus pulposus), cervical 10/10/2021   Other spondylosis with radiculopathy, cervical region    Spinal stenosis of cervical region 09/21/2021   Past Medical History:   Diagnosis Date   Arthritis    COPD (chronic obstructive pulmonary disease) (HCC)    Dyspnea    GERD (gastroesophageal reflux disease)    Hypertension    Neck pain    Shoulder pain     No family history on file.  Past Surgical History:  Procedure Laterality Date   ANTERIOR CERVICAL DECOMP/DISCECTOMY FUSION N/A 10/10/2021   Procedure: CERVICAL FIVE-SIX, CERVICAL SIX-SEVEN ANTERIOR CERVICAL DECOMPRESSION/DISCECTOMY FUSION;  Surgeon: Marybelle Killings, MD;  Location: [facility];  Service: Orthopedics;  Laterality: N/A;   NO PAST SURGERIES     Social History   Occupational History   Not on file  Tobacco Use   Smoking status: Every Day    Packs/day: 0.25    Types: Cigarettes   Smokeless tobacco: Never  Vaping Use   Vaping Use: Former   Substances: Flavoring  Substance and Sexual Activity   Alcohol use: Never   Drug use: Never   Sexual activity: Not on file

## 2022-03-10 ENCOUNTER — Encounter (HOSPITAL_COMMUNITY)
Admission: RE | Admit: 2022-03-10 | Discharge: 2022-03-10 | Disposition: A | Discharge status: Home / Self Care | Payer: 59 | Source: Ambulatory Visit | Attending: Gastroenterology | Admitting: Gastroenterology

## 2022-03-10 ENCOUNTER — Other Ambulatory Visit: Payer: Self-pay

## 2022-03-15 ENCOUNTER — Other Ambulatory Visit (HOSPITAL_COMMUNITY): Payer: Self-pay | Admitting: Neurology

## 2022-03-15 ENCOUNTER — Ambulatory Visit (HOSPITAL_COMMUNITY)
Admission: RE | Admit: 2022-03-15 | Discharge: 2022-03-15 | Disposition: A | Discharge status: Home / Self Care | Payer: 59 | Source: Ambulatory Visit | Attending: Neurology | Admitting: Neurology

## 2022-03-15 DIAGNOSIS — M545 Low back pain, unspecified: Secondary | ICD-10-CM | POA: Diagnosis not present

## 2022-03-15 DIAGNOSIS — I639 Cerebral infarction, unspecified: Secondary | ICD-10-CM | POA: Insufficient documentation

## 2022-03-15 DIAGNOSIS — M549 Dorsalgia, unspecified: Secondary | ICD-10-CM | POA: Diagnosis not present

## 2022-03-15 DIAGNOSIS — M546 Pain in thoracic spine: Secondary | ICD-10-CM

## 2022-03-15 DIAGNOSIS — G8194 Hemiplegia, unspecified affecting left nondominant side: Secondary | ICD-10-CM | POA: Insufficient documentation

## 2022-03-15 DIAGNOSIS — G9389 Other specified disorders of brain: Secondary | ICD-10-CM | POA: Diagnosis not present

## 2022-03-16 ENCOUNTER — Ambulatory Visit (HOSPITAL_BASED_OUTPATIENT_CLINIC_OR_DEPARTMENT_OTHER): Payer: 59 | Admitting: Anesthesiology

## 2022-03-16 ENCOUNTER — Encounter (HOSPITAL_COMMUNITY)
Admission: RE | Disposition: A | Discharge status: Home / Self Care | Payer: Self-pay | Source: Home / Self Care | Attending: Gastroenterology

## 2022-03-16 ENCOUNTER — Ambulatory Visit (HOSPITAL_COMMUNITY)
Admission: RE | Admit: 2022-03-16 | Discharge: 2022-03-16 | Disposition: A | Discharge status: Home / Self Care | Payer: 59 | Attending: Gastroenterology | Admitting: Gastroenterology

## 2022-03-16 ENCOUNTER — Ambulatory Visit (HOSPITAL_COMMUNITY): Payer: 59 | Admitting: Anesthesiology

## 2022-03-16 ENCOUNTER — Encounter (INDEPENDENT_AMBULATORY_CARE_PROVIDER_SITE_OTHER): Payer: Self-pay | Admitting: *Deleted

## 2022-03-16 DIAGNOSIS — M542 Cervicalgia: Secondary | ICD-10-CM | POA: Insufficient documentation

## 2022-03-16 DIAGNOSIS — R1310 Dysphagia, unspecified: Secondary | ICD-10-CM | POA: Diagnosis not present

## 2022-03-16 DIAGNOSIS — K635 Polyp of colon: Secondary | ICD-10-CM

## 2022-03-16 DIAGNOSIS — D122 Benign neoplasm of ascending colon: Secondary | ICD-10-CM | POA: Diagnosis not present

## 2022-03-16 DIAGNOSIS — K317 Polyp of stomach and duodenum: Secondary | ICD-10-CM | POA: Diagnosis not present

## 2022-03-16 DIAGNOSIS — F17210 Nicotine dependence, cigarettes, uncomplicated: Secondary | ICD-10-CM | POA: Insufficient documentation

## 2022-03-16 DIAGNOSIS — K621 Rectal polyp: Secondary | ICD-10-CM

## 2022-03-16 DIAGNOSIS — K5730 Diverticulosis of large intestine without perforation or abscess without bleeding: Secondary | ICD-10-CM | POA: Diagnosis not present

## 2022-03-16 DIAGNOSIS — R0609 Other forms of dyspnea: Secondary | ICD-10-CM | POA: Insufficient documentation

## 2022-03-16 DIAGNOSIS — D125 Benign neoplasm of sigmoid colon: Secondary | ICD-10-CM | POA: Diagnosis not present

## 2022-03-16 DIAGNOSIS — D128 Benign neoplasm of rectum: Secondary | ICD-10-CM | POA: Insufficient documentation

## 2022-03-16 DIAGNOSIS — D120 Benign neoplasm of cecum: Secondary | ICD-10-CM | POA: Insufficient documentation

## 2022-03-16 DIAGNOSIS — J449 Chronic obstructive pulmonary disease, unspecified: Secondary | ICD-10-CM | POA: Insufficient documentation

## 2022-03-16 DIAGNOSIS — D132 Benign neoplasm of duodenum: Secondary | ICD-10-CM | POA: Diagnosis not present

## 2022-03-16 DIAGNOSIS — D123 Benign neoplasm of transverse colon: Secondary | ICD-10-CM | POA: Insufficient documentation

## 2022-03-16 DIAGNOSIS — D124 Benign neoplasm of descending colon: Secondary | ICD-10-CM | POA: Diagnosis not present

## 2022-03-16 DIAGNOSIS — M1990 Unspecified osteoarthritis, unspecified site: Secondary | ICD-10-CM | POA: Insufficient documentation

## 2022-03-16 DIAGNOSIS — I10 Essential (primary) hypertension: Secondary | ICD-10-CM | POA: Diagnosis not present

## 2022-03-16 DIAGNOSIS — Z1211 Encounter for screening for malignant neoplasm of colon: Secondary | ICD-10-CM

## 2022-03-16 DIAGNOSIS — K219 Gastro-esophageal reflux disease without esophagitis: Secondary | ICD-10-CM | POA: Insufficient documentation

## 2022-03-16 HISTORY — PX: COLONOSCOPY WITH PROPOFOL: SHX5780

## 2022-03-16 HISTORY — PX: POLYPECTOMY: SHX5525

## 2022-03-16 HISTORY — PX: ESOPHAGOGASTRODUODENOSCOPY (EGD) WITH PROPOFOL: SHX5813

## 2022-03-16 HISTORY — PX: BIOPSY: SHX5522

## 2022-03-16 HISTORY — PX: SAVORY DILATION: SHX5439

## 2022-03-16 LAB — HM COLONOSCOPY

## 2022-03-16 SURGERY — COLONOSCOPY WITH PROPOFOL
Anesthesia: General

## 2022-03-16 MED ORDER — PROPOFOL 500 MG/50ML IV EMUL
INTRAVENOUS | Status: DC | PRN
  Administered 2022-03-16: 100 ug/kg/min via INTRAVENOUS

## 2022-03-16 MED ORDER — LACTATED RINGERS IV SOLN: INTRAVENOUS | Status: DC

## 2022-03-16 MED ORDER — PROPOFOL 10 MG/ML IV BOLUS
INTRAVENOUS | Status: DC | PRN
  Administered 2022-03-16: 30 mg via INTRAVENOUS
  Administered 2022-03-16: 140 mg via INTRAVENOUS
  Administered 2022-03-16: 30 mg via INTRAVENOUS

## 2022-03-16 NOTE — Anesthesia Postprocedure Evaluation (Signed)
Anesthesia Post Note  Patient: Glendell Fouse  Procedure(s) Performed: COLONOSCOPY WITH PROPOFOL ESOPHAGOGASTRODUODENOSCOPY (EGD) WITH PROPOFOL POLYPECTOMY SAVORY DILATION BIOPSY  Patient location during evaluation: Phase II Anesthesia Type: General Level of consciousness: awake and alert and oriented Pain management: pain level controlled Vital Signs Assessment: post-procedure vital signs reviewed and stable Respiratory status: spontaneous breathing, nonlabored ventilation and respiratory function stable Cardiovascular status: blood pressure returned to baseline and stable Postop Assessment: no apparent nausea or vomiting Anesthetic complications: no   No notable events documented.   Last Vitals:  Vitals:   03/16/22 1104 03/16/22 1213  BP: 134/81 (!) 117/91  Pulse: 69 68  Resp: 16 18  Temp: 36.7 C (!) 36.4 C  SpO2: 94% 92%    Last Pain:  Vitals:   03/16/22 1213  TempSrc: Oral  PainSc:                  Kalmen Lollar C Waleska Buttery

## 2022-03-16 NOTE — Anesthesia Procedure Notes (Signed)
Date/Time: 03/16/2022 11:20 AM  Performed by: Vista Deck, CRNAPre-anesthesia Checklist: Patient identified, Emergency Drugs available, Suction available, Timeout performed and Patient being monitored Patient Re-evaluated:Patient Re-evaluated prior to induction Oxygen Delivery Method: Nasal Cannula

## 2022-03-16 NOTE — Interval H&P Note (Signed)
History and Physical Interval Note:  03/16/2022 11:13 AM  Julian Perez  has presented today for surgery, with the diagnosis of Screening Colonoscopy Dysphagia.  The various methods of treatment have been discussed with the patient and family. After consideration of risks, benefits and other options for treatment, the patient has consented to  Procedure(s) with comments: COLONOSCOPY WITH PROPOFOL (N/A) - 1230 ASA 2 ESOPHAGOGASTRODUODENOSCOPY (EGD) WITH PROPOFOL (N/A) as a surgical intervention.  The patient's history has been reviewed, patient examined, no change in status, stable for surgery.  I have reviewed the patient's chart and labs.  Questions were answered to the patient's satisfaction.     [name]

## 2022-03-16 NOTE — Transfer of Care (Signed)
Immediate Anesthesia Transfer of Care Note  Patient: Julian Perez  Procedure(s) Performed: COLONOSCOPY WITH PROPOFOL ESOPHAGOGASTRODUODENOSCOPY (EGD) WITH PROPOFOL POLYPECTOMY SAVORY DILATION BIOPSY  Patient Location: Short Stay  Anesthesia Type:General  Level of Consciousness: awake and patient cooperative  Airway & Oxygen Therapy: Patient Spontanous Breathing  Post-op Assessment: Report given to RN and Post -op Vital signs reviewed and stable  Post vital signs: Reviewed and stable  Last Vitals:  Vitals Value Taken Time  BP 117/91 03/16/22 1213  Temp 36.4 C 03/16/22 1213  Pulse 68 03/16/22 1213  Resp 18 03/16/22 1213  SpO2 92 % 03/16/22 1213    Last Pain:  Vitals:   03/16/22 1213  TempSrc: Oral  PainSc:       Patients Stated Pain Goal: 5 (16/60/63 0160)  Complications: No notable events documented.

## 2022-03-16 NOTE — Op Note (Signed)
[facility] Patient Name: Julian Perez Procedure Date: 03/16/2022 11:17 AM MRN: [id number] Date of Birth: [birth year] Attending MD: Maylon Peppers ,  CSN: [id number] Age: 56 Admit Type: Outpatient Procedure:                Upper GI endoscopy Indications:              Dysphagia Providers:                Maylon Peppers, Lambert Mody, Caprice Kluver,                            Everardo Pacific Referring MD:              Medicines:                Monitored Anesthesia Care Complications:            No immediate complications. Estimated Blood Loss:     Estimated blood loss: none. Procedure:                Pre-Anesthesia Assessment:                           - Prior to the procedure, a History and Physical                            was performed, and patient medications, allergies                            and sensitivities were reviewed. The patient's                            tolerance of previous anesthesia was reviewed.                           - The risks and benefits of the procedure and the                            sedation options and risks were discussed with the                            patient. All questions were answered and informed                            consent was obtained.                           - ASA Grade Assessment: II - A patient with mild                            systemic disease.                           After obtaining informed consent, the endoscope was                            passed under direct vision. Throughout the  procedure, the patient's blood pressure, pulse, and                            oxygen saturations were monitored continuously. The                            [id number]-[id number] ([id number]) scope was introduced through the                            mouth, and advanced to the second part of duodenum.                            The upper GI endoscopy was accomplished without                             difficulty. The patient tolerated the procedure                            well. Scope In: 11:28:02 AM Scope Out: 11:35:54 AM Total Procedure Duration: 0 hours 7 minutes 52 seconds  Findings:      No endoscopic abnormality was evident in the esophagus to explain the       patient's complaint of dysphagia. It was decided, however, to proceed       with dilation of the entire esophagus. A guidewire was placed and the       scope was withdrawn. Dilation was performed with a Savary dilator with       no resistance at 18 mm. No mucosal disruption was seen upon reinspection.      The stomach was normal.      A single 2 mm sessile polyp with no bleeding was found in the second       portion of the duodenum. The polyp was removed with a cold biopsy       forceps. Resection and retrieval were complete. Impression:               - No endoscopic esophageal abnormality to explain                            patient's dysphagia. Esophagus dilated. Dilated.                           - Normal stomach.                           - A single duodenal polyp. Resected and retrieved. Moderate Sedation:      Per Anesthesia Care Recommendation:           - Discharge patient to home (ambulatory).                           - Resume previous diet.                           - Await pathology results.                           - If  persistent dysphagia, may proceed with barium                            esophagram . Query possible opiate induced                            dysphagia. Procedure Code(s):        --- Professional ---                           [phone], Esophagogastroduodenoscopy, flexible,                            transoral; with insertion of guide wire followed by                            passage of dilator(s) through esophagus over guide                            wire                           43239, 59, Esophagogastroduodenoscopy, flexible,                            transoral; with biopsy,  single or multiple Diagnosis Code(s):        --- Professional ---                           R13.10, Dysphagia, unspecified                           K31.7, Polyp of stomach and duodenum CPT copyright 2019 American Medical Association. All rights reserved. The codes documented in this report are preliminary and upon coder review may  be revised to meet current compliance requirements. Maylon Peppers, MD Maylon Peppers,  03/16/2022 11:40:44 AM This report has been signed electronically. Number of Addenda: 0

## 2022-03-16 NOTE — Anesthesia Preprocedure Evaluation (Signed)
Anesthesia Evaluation  Patient identified by MRN, date of birth, ID band Patient awake    Reviewed: Allergy & Precautions, NPO status , Patient's Chart, lab work & pertinent test results  Airway Mallampati: II  TM Distance: >3 FB Neck ROM: Full   Comment: ACDF, neck pain Dental  (+) Dental Advisory Given, Edentulous Upper, Loose, Missing,    Pulmonary shortness of breath and with exertion, COPD,  COPD inhaler, Current Smoker (used to smoke 2 packs/day)Patient did not abstain from smoking.,    breath sounds clear to auscultation       Cardiovascular Exercise Tolerance: Poor hypertension, Pt. on medications + DOE  Normal cardiovascular exam Rhythm:Regular Rate:Normal     Neuro/Psych  Neuromuscular disease negative psych ROS   GI/Hepatic Neg liver ROS, GERD  Medicated,  Endo/Other  negative endocrine ROS  Renal/GU negative Renal ROS  negative genitourinary   Musculoskeletal  (+) Arthritis , Osteoarthritis,  Cervical spinal stenosis, ACDF   Abdominal   Peds negative pediatric ROS (+)  Hematology negative hematology ROS (+)   Anesthesia Other Findings ACDF  Reproductive/Obstetrics negative OB ROS                             Anesthesia Physical Anesthesia Plan  ASA: 3  Anesthesia Plan: General   Post-op Pain Management: Minimal or no pain anticipated   Induction: Intravenous  PONV Risk Score and Plan: Propofol infusion  Airway Management Planned: Natural Airway and Nasal Cannula  Additional Equipment:   Intra-op Plan:   Post-operative Plan:   Informed Consent: I have reviewed the patients History and Physical, chart, labs and discussed the procedure including the risks, benefits and alternatives for the proposed anesthesia with the patient or authorized representative who has indicated his/her understanding and acceptance.     Dental advisory given  Plan Discussed with: CRNA  and Surgeon  Anesthesia Plan Comments:         Anesthesia Quick Evaluation

## 2022-03-16 NOTE — Discharge Instructions (Addendum)
You are being discharged to home.  Resume your previous diet.  We are waiting for your pathology results.  If persistent dysphagia, may proceed with barium esophagram Your physician has recommended a repeat colonoscopy in three years for surveillance.

## 2022-03-16 NOTE — Op Note (Signed)
[facility] Patient Name: Julian Perez Procedure Date: 03/16/2022 11:16 AM MRN: [id number] Date of Birth: 04-Oct-1965 Attending MD: Maylon Peppers ,  CSN: [id number] Age: 56 Admit Type: Outpatient Procedure:                Colonoscopy Indications:              Screening for colorectal malignant neoplasm Providers:                Maylon Peppers, Lambert Mody, Caprice Kluver,                            Everardo Pacific Referring MD:              Medicines:                Monitored Anesthesia Care Complications:            No immediate complications. Estimated Blood Loss:     Estimated blood loss: none. Procedure:                Pre-Anesthesia Assessment:                           - Prior to the procedure, a History and Physical                            was performed, and patient medications, allergies                            and sensitivities were reviewed. The patient's                            tolerance of previous anesthesia was reviewed.                           - The risks and benefits of the procedure and the                            sedation options and risks were discussed with the                            patient. All questions were answered and informed                            consent was obtained.                           - ASA Grade Assessment: II - A patient with mild                            systemic disease.                           After obtaining informed consent, the colonoscope                            was passed under direct vision. Throughout the  procedure, the patient's blood pressure, pulse, and                            oxygen saturations were monitored continuously. The                            [id number]-HQ190L ([id number]) scope was introduced through                            the anus and advanced to the the cecum, identified                            by appendiceal orifice and ileocecal valve. The                             colonoscopy was performed without difficulty. The                            patient tolerated the procedure well. The quality                            of the bowel preparation was good. Scope In: 11:41:56 AM Scope Out: 12:07:25 PM Scope Withdrawal Time: 0 hours 21 minutes 0 seconds  Total Procedure Duration: 0 hours 25 minutes 29 seconds  Findings:      The perianal and digital rectal examinations were normal.      Seven sessile polyps were found in the transverse colon, ascending colon       and cecum. The polyps were 3 to 8 mm in size. These polyps were removed       with a cold snare. Resection and retrieval were complete.      Five sessile polyps were found in the rectum, sigmoid colon and       descending colon. The polyps were 3 to 6 mm in size. These polyps were       removed with a cold snare. Resection and retrieval were complete.      Scattered small and large-mouthed diverticula were found in the sigmoid       colon and descending colon.      The retroflexed view of the distal rectum and anal verge was normal and       showed no anal or rectal abnormalities. Impression:               - Seven 3 to 8 mm polyps in the transverse colon,                            in the ascending colon and in the cecum, removed                            with a cold snare. Resected and retrieved.                           - Five 3 to 6 mm polyps in the rectum, in the  sigmoid colon and in the descending colon, removed                            with a cold snare. Resected and retrieved.                           - Diverticulosis in the sigmoid colon and in the                            descending colon.                           - The distal rectum and anal verge are normal on                            retroflexion view. Moderate Sedation:      Per Anesthesia Care Recommendation:           - Discharge patient to home (ambulatory).                            - Resume previous diet.                           - Await pathology results.                           - Repeat colonoscopy in 3 years for surveillance. Procedure Code(s):        --- Professional ---                           [phone], Colonoscopy, flexible; with removal of                            tumor(s), polyp(s), or other lesion(s) by snare                            technique Diagnosis Code(s):        --- Professional ---                           Z12.11, Encounter for screening for malignant                            neoplasm of colon                           K62.1, Rectal polyp                           K63.5, Polyp of colon                           K57.30, Diverticulosis of large intestine without                            perforation or abscess without bleeding CPT copyright 2019 American  Medical Association. All rights reserved. The codes documented in this report are preliminary and upon coder review may  be revised to meet current compliance requirements. Maylon Peppers, MD Maylon Peppers,  03/16/2022 12:13:37 PM This report has been signed electronically. Number of Addenda: 0

## 2022-03-17 DIAGNOSIS — L821 Other seborrheic keratosis: Secondary | ICD-10-CM | POA: Diagnosis not present

## 2022-03-17 DIAGNOSIS — C44329 Squamous cell carcinoma of skin of other parts of face: Secondary | ICD-10-CM | POA: Diagnosis not present

## 2022-03-17 DIAGNOSIS — C4442 Squamous cell carcinoma of skin of scalp and neck: Secondary | ICD-10-CM | POA: Diagnosis not present

## 2022-03-17 DIAGNOSIS — L570 Actinic keratosis: Secondary | ICD-10-CM | POA: Diagnosis not present

## 2022-03-17 LAB — SURGICAL PATHOLOGY

## 2022-03-20 DIAGNOSIS — L239 Allergic contact dermatitis, unspecified cause: Secondary | ICD-10-CM | POA: Diagnosis not present

## 2022-03-22 ENCOUNTER — Encounter (HOSPITAL_COMMUNITY): Payer: Self-pay | Admitting: Gastroenterology

## 2022-03-26 NOTE — Progress Notes (Signed)
57 year old male who is 1 week status post C5-6 and C6-7 ACDF returns.  States that he is doing well.  Wearing his cervical collar.  He continues to have some numbness and tingling in the left hand.   Exam Pleasant male alert and oriented in no acute distress.  Surgical incision looks good.  No drainage or signs of infection.  Patient is neurologically intact.   Plan Patient will follow-up in the office in 5 weeks for recheck with Dr. Lorin Mercy.  He would likely get x-ray at that time.  He was continue wearing his collar.  No aggressive activity.  No lifting pushing pulling driving.

## 2022-04-06 DIAGNOSIS — M5412 Radiculopathy, cervical region: Secondary | ICD-10-CM | POA: Diagnosis not present

## 2022-04-06 DIAGNOSIS — M25511 Pain in right shoulder: Secondary | ICD-10-CM | POA: Diagnosis not present

## 2022-04-06 DIAGNOSIS — M1990 Unspecified osteoarthritis, unspecified site: Secondary | ICD-10-CM | POA: Diagnosis not present

## 2022-04-06 DIAGNOSIS — Z79891 Long term (current) use of opiate analgesic: Secondary | ICD-10-CM | POA: Diagnosis not present

## 2022-04-06 DIAGNOSIS — M542 Cervicalgia: Secondary | ICD-10-CM | POA: Diagnosis not present

## 2022-04-06 DIAGNOSIS — M545 Low back pain, unspecified: Secondary | ICD-10-CM | POA: Diagnosis not present

## 2022-04-06 DIAGNOSIS — G8194 Hemiplegia, unspecified affecting left nondominant side: Secondary | ICD-10-CM | POA: Diagnosis not present

## 2022-04-06 DIAGNOSIS — M25519 Pain in unspecified shoulder: Secondary | ICD-10-CM | POA: Diagnosis not present

## 2022-04-20 DIAGNOSIS — C44329 Squamous cell carcinoma of skin of other parts of face: Secondary | ICD-10-CM | POA: Diagnosis not present

## 2022-04-27 DIAGNOSIS — C4442 Squamous cell carcinoma of skin of scalp and neck: Secondary | ICD-10-CM | POA: Diagnosis not present

## 2022-05-08 DIAGNOSIS — L570 Actinic keratosis: Secondary | ICD-10-CM | POA: Diagnosis not present

## 2022-05-22 ENCOUNTER — Ambulatory Visit (INDEPENDENT_AMBULATORY_CARE_PROVIDER_SITE_OTHER): Payer: 59 | Admitting: Gastroenterology

## 2022-05-22 DIAGNOSIS — R7303 Prediabetes: Secondary | ICD-10-CM | POA: Diagnosis not present

## 2022-05-22 DIAGNOSIS — Z139 Encounter for screening, unspecified: Secondary | ICD-10-CM | POA: Diagnosis not present

## 2022-05-22 DIAGNOSIS — E782 Mixed hyperlipidemia: Secondary | ICD-10-CM | POA: Diagnosis not present

## 2022-05-22 DIAGNOSIS — E559 Vitamin D deficiency, unspecified: Secondary | ICD-10-CM | POA: Diagnosis not present

## 2022-05-31 DIAGNOSIS — M19011 Primary osteoarthritis, right shoulder: Secondary | ICD-10-CM | POA: Diagnosis not present

## 2022-05-31 DIAGNOSIS — G894 Chronic pain syndrome: Secondary | ICD-10-CM | POA: Diagnosis not present

## 2022-05-31 DIAGNOSIS — M542 Cervicalgia: Secondary | ICD-10-CM | POA: Diagnosis not present

## 2022-05-31 DIAGNOSIS — E559 Vitamin D deficiency, unspecified: Secondary | ICD-10-CM | POA: Diagnosis not present

## 2022-05-31 DIAGNOSIS — K219 Gastro-esophageal reflux disease without esophagitis: Secondary | ICD-10-CM | POA: Diagnosis not present

## 2022-05-31 DIAGNOSIS — E782 Mixed hyperlipidemia: Secondary | ICD-10-CM | POA: Diagnosis not present

## 2022-05-31 DIAGNOSIS — R259 Unspecified abnormal involuntary movements: Secondary | ICD-10-CM | POA: Diagnosis not present

## 2022-05-31 DIAGNOSIS — R69 Illness, unspecified: Secondary | ICD-10-CM | POA: Diagnosis not present

## 2022-05-31 DIAGNOSIS — F418 Other specified anxiety disorders: Secondary | ICD-10-CM | POA: Diagnosis not present

## 2022-05-31 DIAGNOSIS — J449 Chronic obstructive pulmonary disease, unspecified: Secondary | ICD-10-CM | POA: Diagnosis not present

## 2022-05-31 DIAGNOSIS — R7303 Prediabetes: Secondary | ICD-10-CM | POA: Diagnosis not present

## 2022-06-05 DIAGNOSIS — G8194 Hemiplegia, unspecified affecting left nondominant side: Secondary | ICD-10-CM | POA: Diagnosis not present

## 2022-06-05 DIAGNOSIS — M545 Low back pain, unspecified: Secondary | ICD-10-CM | POA: Diagnosis not present

## 2022-06-05 DIAGNOSIS — M542 Cervicalgia: Secondary | ICD-10-CM | POA: Diagnosis not present

## 2022-06-05 DIAGNOSIS — M1990 Unspecified osteoarthritis, unspecified site: Secondary | ICD-10-CM | POA: Diagnosis not present

## 2022-06-05 DIAGNOSIS — Z79891 Long term (current) use of opiate analgesic: Secondary | ICD-10-CM | POA: Diagnosis not present

## 2022-06-05 DIAGNOSIS — M5412 Radiculopathy, cervical region: Secondary | ICD-10-CM | POA: Diagnosis not present

## 2022-06-05 DIAGNOSIS — M25511 Pain in right shoulder: Secondary | ICD-10-CM | POA: Diagnosis not present

## 2022-06-05 DIAGNOSIS — M25519 Pain in unspecified shoulder: Secondary | ICD-10-CM | POA: Diagnosis not present

## 2022-06-14 ENCOUNTER — Ambulatory Visit: Payer: 59 | Admitting: Dermatology

## 2022-08-15 ENCOUNTER — Other Ambulatory Visit (HOSPITAL_COMMUNITY): Payer: Self-pay | Admitting: Family Medicine

## 2022-08-15 DIAGNOSIS — R61 Generalized hyperhidrosis: Secondary | ICD-10-CM | POA: Insufficient documentation

## 2022-08-15 DIAGNOSIS — R109 Unspecified abdominal pain: Secondary | ICD-10-CM

## 2022-10-02 ENCOUNTER — Encounter (HOSPITAL_COMMUNITY): Payer: Self-pay | Admitting: Radiology

## 2022-10-02 ENCOUNTER — Ambulatory Visit (HOSPITAL_COMMUNITY)
Admission: RE | Admit: 2022-10-02 | Discharge: 2022-10-02 | Disposition: A | Discharge status: Home / Self Care | Payer: PRIVATE HEALTH INSURANCE | Source: Ambulatory Visit | Attending: Family Medicine | Admitting: Family Medicine

## 2022-10-02 DIAGNOSIS — R109 Unspecified abdominal pain: Secondary | ICD-10-CM | POA: Insufficient documentation

## 2022-10-02 MED ORDER — IOHEXOL 300 MG/ML  SOLN
100.0000 mL | Freq: Once | INTRAMUSCULAR | Status: AC | PRN
  Administered 2022-10-02: 100 mL via INTRAVENOUS

## 2023-01-22 ENCOUNTER — Ambulatory Visit: Payer: PRIVATE HEALTH INSURANCE

## 2023-01-22 ENCOUNTER — Ambulatory Visit
Admission: EM | Admit: 2023-01-22 | Discharge: 2023-01-22 | Disposition: A | Discharge status: Home / Self Care | Payer: Medicaid Other | Source: Home / Self Care

## 2023-01-22 DIAGNOSIS — S92405A Nondisplaced unspecified fracture of left great toe, initial encounter for closed fracture: Secondary | ICD-10-CM | POA: Diagnosis not present

## 2023-01-22 MED ORDER — DOXYCYCLINE HYCLATE 100 MG PO CAPS: 100.0000 mg | ORAL_CAPSULE | Freq: Two times a day (BID) | ORAL | 0 refills | Status: AC

## 2023-01-22 NOTE — Discharge Instructions (Signed)
Call [facility] as soon as possible for a follow up. Take the full course of antibiotics and wear the walking boot when up and moving. Elevate foot at rest to help with swelling  Address: [address] [place], [place], [place] [address] Phone: [phone]

## 2023-01-22 NOTE — ED Notes (Signed)
Pt went outside to smoke, pt states he will be right back when done.

## 2023-01-22 NOTE — ED Triage Notes (Signed)
Pt states a large tumble/cup fell on left big toe, that happened 2 days ago now having bruising and redness with swelling.

## 2023-01-26 NOTE — ED Provider Notes (Signed)
[facility]    CSN: [id number] Arrival date & time: 01/22/23  1417      History   Chief Complaint Chief Complaint  Patient presents with   Toe Pain    HPI Julian Perez is a 57 y.o. male.   Patient presenting today with pain, discoloration to the left great toe that occurred 2 days ago when a large metal tumbler fell onto the toe.  He states significant pain with ambulation, bruising and bleeding off-and-on from under the toenail.  So far trying elevating and over-the-counter pain relievers with minimal relief.    Past Medical History:  Diagnosis Date   Arthritis    COPD (chronic obstructive pulmonary disease) (HCC)    Dyspnea    GERD (gastroesophageal reflux disease)    Hypertension    Neck pain    Shoulder pain     Patient Active Problem List   Diagnosis Date Noted   Dysphagia 02/14/2022   Abdominal bloating 02/14/2022   Encounter for screening colonoscopy 02/14/2022   HNP (herniated nucleus pulposus), cervical 10/10/2021   Other spondylosis with radiculopathy, cervical region    Spinal stenosis of cervical region 09/21/2021    Past Surgical History:  Procedure Laterality Date   ANTERIOR CERVICAL DECOMP/DISCECTOMY FUSION N/A 10/10/2021   Procedure: CERVICAL FIVE-SIX, CERVICAL SIX-SEVEN ANTERIOR CERVICAL DECOMPRESSION/DISCECTOMY FUSION;  Surgeon: Eldred Manges, MD;  Location: [facility];  Service: Orthopedics;  Laterality: N/A;   BIOPSY  03/16/2022   Procedure: BIOPSY;  Surgeon: Dolores Frame, MD;  Location: [facility];  Service: Gastroenterology;;   COLONOSCOPY WITH PROPOFOL N/A 03/16/2022   Procedure: COLONOSCOPY WITH PROPOFOL;  Surgeon: Dolores Frame, MD;  Location: [facility];  Service: Gastroenterology;  Laterality: N/A;  1230 ASA 2   ESOPHAGOGASTRODUODENOSCOPY (EGD) WITH PROPOFOL N/A 03/16/2022   Procedure: ESOPHAGOGASTRODUODENOSCOPY (EGD) WITH PROPOFOL;  Surgeon: Dolores Frame, MD;  Location: [facility];   Service: Gastroenterology;  Laterality: N/A;   NO PAST SURGERIES     POLYPECTOMY  03/16/2022   Procedure: POLYPECTOMY;  Surgeon: Dolores Frame, MD;  Location: [facility];  Service: Gastroenterology;;   Gaspar Bidding DILATION  03/16/2022   Procedure: Gaspar Bidding DILATION;  Surgeon: Marguerita Merles, Reuel Boom, MD;  Location: [facility];  Service: Gastroenterology;;       Home Medications    Prior to Admission medications   Medication Sig Start Date End Date Taking? Authorizing Provider  aspirin EC 81 MG tablet Take 81 mg by mouth daily. Swallow whole.   Yes [provider]  atorvastatin (LIPITOR) 10 MG tablet Take 10 mg by mouth at bedtime. 07/12/21  Yes [provider]  doxycycline (VIBRAMYCIN) 100 MG capsule Take 1 capsule (100 mg total) by mouth 2 (two) times daily. 01/22/23  Yes Particia Nearing, PA-C  DULoxetine (CYMBALTA) 60 MG capsule Take 60 mg by mouth daily. 09/11/21  Yes [provider]  methocarbamol (ROBAXIN) 750 MG tablet Take 750 mg by mouth 3 (three) times daily. 09/06/21  Yes [provider]  omeprazole (PRILOSEC) 40 MG capsule Take 40 mg by mouth daily. 09/20/21  Yes [provider]  oxyCODONE-acetaminophen (PERCOCET) 10-325 MG tablet Take 1 tablet by mouth in the morning, at noon, and at bedtime. 09/20/21  Yes [provider]  pregabalin (LYRICA) 50 MG capsule Take 50 mg by mouth every 12 (twelve) hours. 01/09/23  Yes [provider]  TRELEGY ELLIPTA 100-62.5-25 MCG/ACT AEPB Take 1 puff by mouth daily. 06/28/21  Yes [provider]  albuterol (PROVENTIL) (2.5 MG/3ML) 0.083% nebulizer solution Take 2.5 mg by nebulization every 6 (six) hours as needed for wheezing or shortness of breath.    [provider]  albuterol (VENTOLIN HFA) 108 (90 Base) MCG/ACT inhaler Inhale 1-2 puffs into the lungs every 6 (six) hours as needed for wheezing or shortness of breath.    [provider]  buPROPion  (WELLBUTRIN XL) 150 MG 24 hr tablet Take 150 mg by mouth daily. 03/02/22   [provider]  gabapentin (NEURONTIN) 800 MG tablet Take 800 mg by mouth 4 (four) times daily. 09/06/21   [provider]    Family History History reviewed. No pertinent family history.  Social History Social History   Tobacco Use   Smoking status: Every Day    Current packs/day: 0.25    Types: Cigarettes   Smokeless tobacco: Never  Vaping Use   Vaping status: Former   Substances: Flavoring  Substance Use Topics   Alcohol use: Never   Drug use: Never     Allergies   Penicillins   Review of Systems Review of Systems Per HPI  Physical Exam Triage Vital Signs ED Triage Vitals [01/22/23 1504]  Encounter Vitals Group     BP (!) 164/79     Systolic BP Percentile      Diastolic BP Percentile      Pulse Rate 85     Resp 16     Temp 98.4 F (36.9 C)     Temp Source Oral     SpO2 96 %     Weight      Height      Head Circumference      Peak Flow      Pain Score 10     Pain Loc      Pain Education      Exclude from Growth Chart    No data found.  Updated Vital Signs BP (!) 164/79 (BP Location: Right Arm)   Pulse 85   Temp 98.4 F (36.9 C) (Oral)   Resp 16   SpO2 96%   Visual Acuity Right Eye Distance:   Left Eye Distance:   Bilateral Distance:    Right Eye Near:   Left Eye Near:    Bilateral Near:     Physical Exam Vitals and nursing note reviewed.  Constitutional:      Appearance: Normal appearance.  HENT:     Head: Atraumatic.  Eyes:     Extraocular Movements: Extraocular movements intact.     Conjunctiva/sclera: Conjunctivae normal.  Cardiovascular:     Rate and Rhythm: Normal rate and regular rhythm.  Pulmonary:     Effort: Pulmonary effort is normal.     Breath sounds: Normal breath sounds.  Musculoskeletal:        General: Swelling, tenderness and signs of injury present.     Cervical back: Normal range of motion and neck supple.      Comments: No range of motion present on exam to the distal left great toe.  Antalgic gait  Skin:    General: Skin is warm.     Comments: Significant subungual hematoma present to the left great toenail, toenail is partially detached in certain areas.  Edema and erythema present to the distal left great toe, with moderate erythema and edema particularly to the area just below the cuticle  Neurological:     General: No focal deficit present.     Mental Status: He is oriented to  person, place, and time.     Comments: Left lower extremity neurovascularly intact  Psychiatric:        Mood and Affect: Mood normal.        Thought Content: Thought content normal.        Judgment: Judgment normal.      UC Treatments / Results  Labs (all labs ordered are listed, but only abnormal results are displayed) Labs Reviewed - No data to display  EKG   Radiology No results found.  Procedures Procedures (including critical care time)  Medications Ordered in UC Medications - No data to display  Initial Impression / Assessment and Plan / UC Course  I have reviewed the triage vital signs and the nursing notes.  Pertinent labs & imaging results that were available during my care of the patient were reviewed by me and considered in my medical decision making (see chart for details).     X-ray of the left foot showing a comminuted fracture of the distal left great toe.  He does also have a significant subungual hematoma present from the injury.  Will place in walking boot which he already has with him.  Doxycycline, good home wound care for suspected infection to the area additionally.  Close follow-up with podiatry recommended as soon as possible. Final Clinical Impressions(s) / UC Diagnoses   Final diagnoses:  Closed nondisplaced fracture of phalanx of left great toe, unspecified phalanx, initial encounter     Discharge Instructions      Call [facility] as soon as possible for a  follow up. Take the full course of antibiotics and wear the walking boot when up and moving. Elevate foot at rest to help with swelling  Address: [address] [place], [place], [place] [address] Phone: [phone]    ED Prescriptions     Medication Sig Dispense Auth. Provider   doxycycline (VIBRAMYCIN) 100 MG capsule Take 1 capsule (100 mg total) by mouth 2 (two) times daily. 20 capsule Particia Nearing, [place]      PDMP not reviewed this encounter.   Particia Nearing, [place] 01/26/23 1449

## 2023-02-02 ENCOUNTER — Ambulatory Visit: Payer: Medicaid Other | Admitting: Podiatry

## 2023-02-02 ENCOUNTER — Encounter: Payer: Self-pay | Admitting: Podiatry

## 2023-02-02 DIAGNOSIS — S92422D Displaced fracture of distal phalanx of left great toe, subsequent encounter for fracture with routine healing: Secondary | ICD-10-CM

## 2023-02-05 NOTE — Progress Notes (Signed)
Subjective:   Patient ID: Julian Perez, male   DOB: 57 y.o.   MRN: [id number]   HPI Patient presents with a broken left toe and states that it is sore still but the nail is really not bothering him much anymore.  Smokes quarter pack of cigarettes per day and is not active   Review of Systems  All other systems reviewed and are negative.       Objective:  Physical Exam Vitals and nursing note reviewed.  Constitutional:      Appearance: He is well-developed.  Pulmonary:     Effort: Pulmonary effort is normal.  Musculoskeletal:        General: Normal range of motion.  Skin:    General: Skin is warm.  Neurological:     Mental Status: He is alert.     Neurovascular status intact muscle strength found to be adequate damaged discolored left hallux nail bed with swelling localized no proximal edema erythema or drainage noted     Assessment:  Damage left hallux nail bed with trauma secondary to injury     Plan:  H&P reviewed the x-ray present and took new x-rays discussed the fracture of the distal phalanx but it does not involve the joint should heal uneventfully will probably take another 8 weeks.  Continue soaks may lose nail  X-rays indicate that there is fracture of the distal portion of the left hallux but localized no other pathology noted

## 2023-03-02 ENCOUNTER — Other Ambulatory Visit (HOSPITAL_BASED_OUTPATIENT_CLINIC_OR_DEPARTMENT_OTHER): Payer: Self-pay | Admitting: Neurology

## 2023-03-02 DIAGNOSIS — M5090 Cervical disc disorder, unspecified, unspecified cervical region: Secondary | ICD-10-CM

## 2023-03-10 ENCOUNTER — Ambulatory Visit (HOSPITAL_COMMUNITY): Payer: PRIVATE HEALTH INSURANCE

## 2023-03-10 ENCOUNTER — Encounter (HOSPITAL_COMMUNITY): Payer: Self-pay

## 2023-04-05 IMAGING — DX DG CHEST 2V
2 series · 2 of 2 positions shown · non-contrast
Comparison: 08/26/2021

CLINICAL DATA: 56-year-old male with a history upcoming surgery

EXAM:
CHEST - 2 VIEW

[w chest pa]
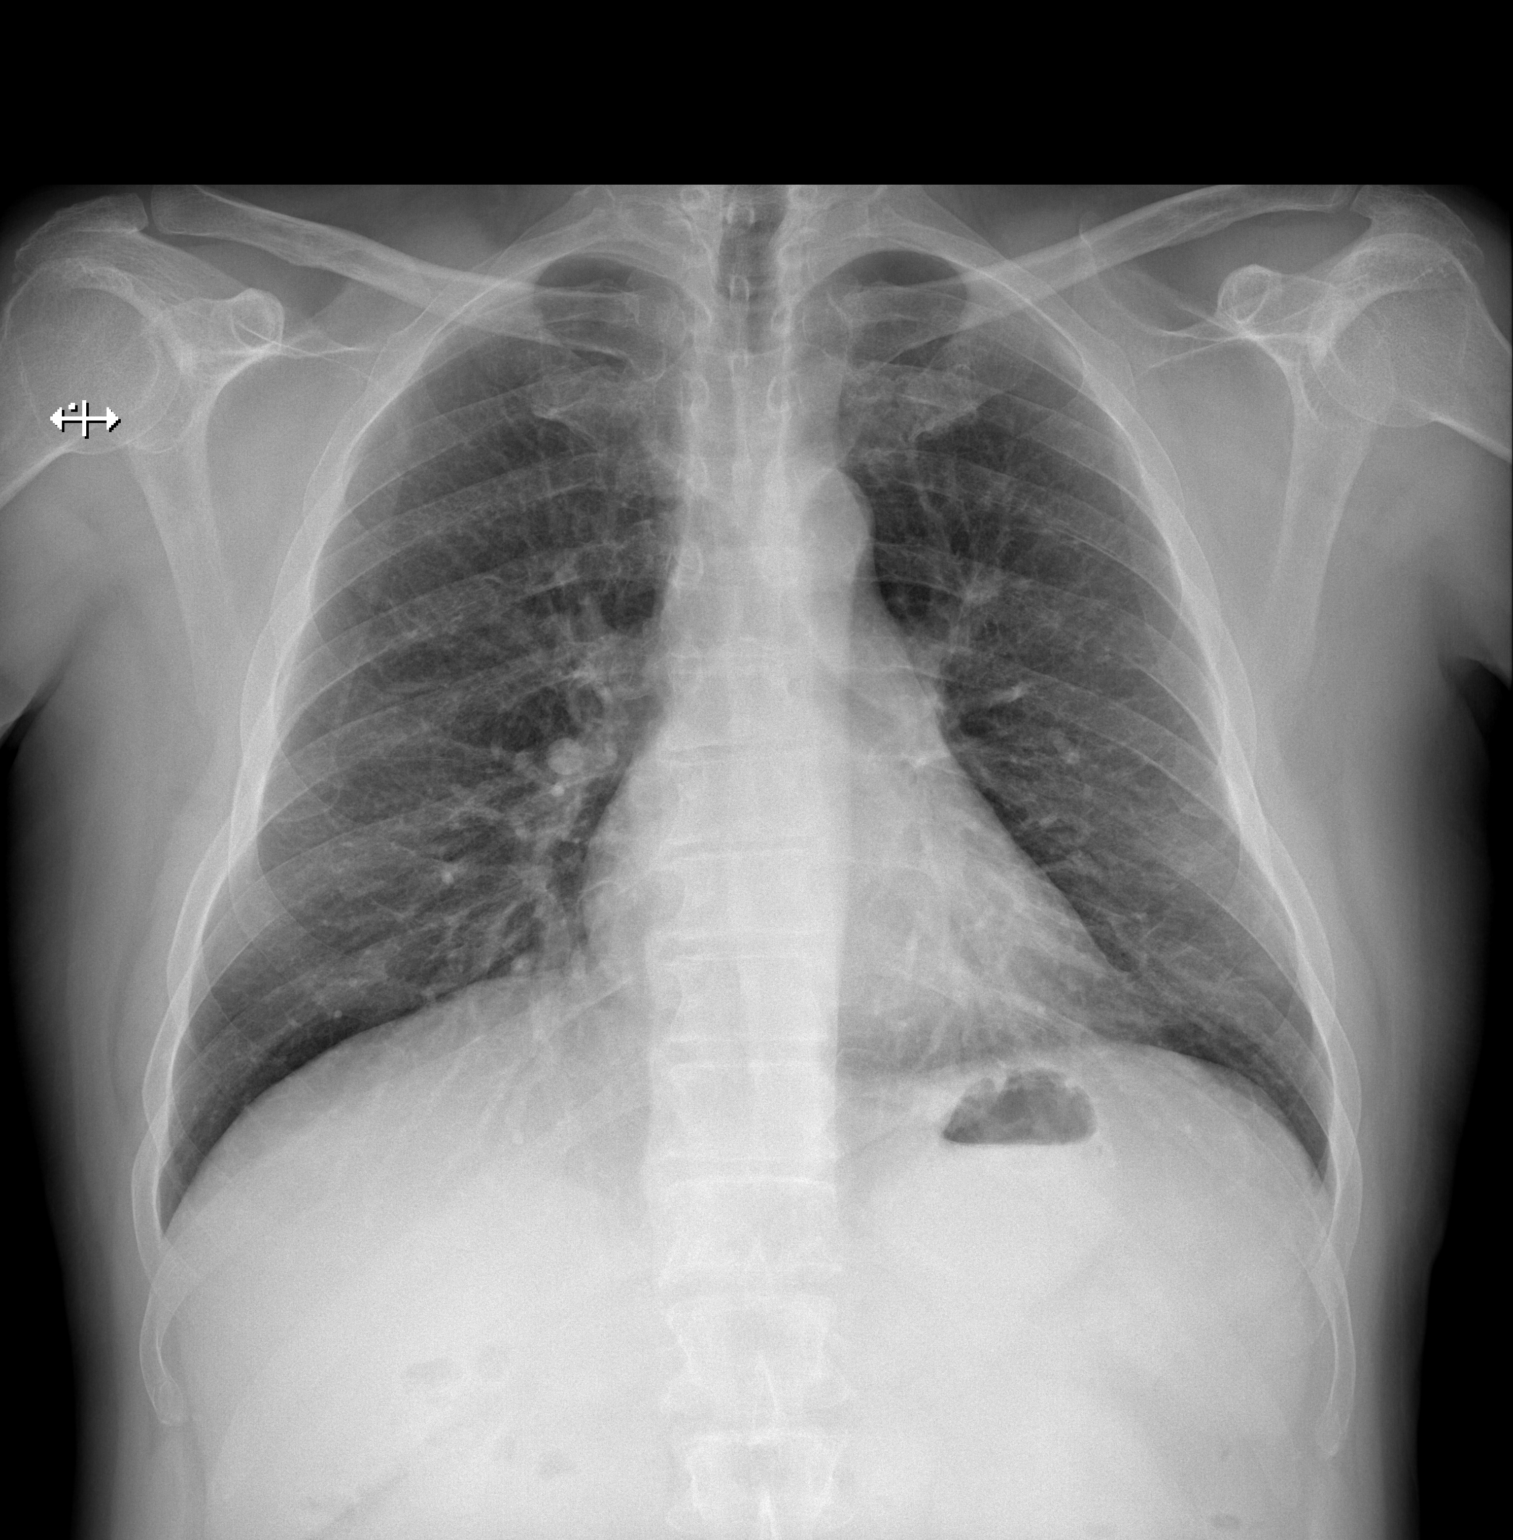

[w chest lat]
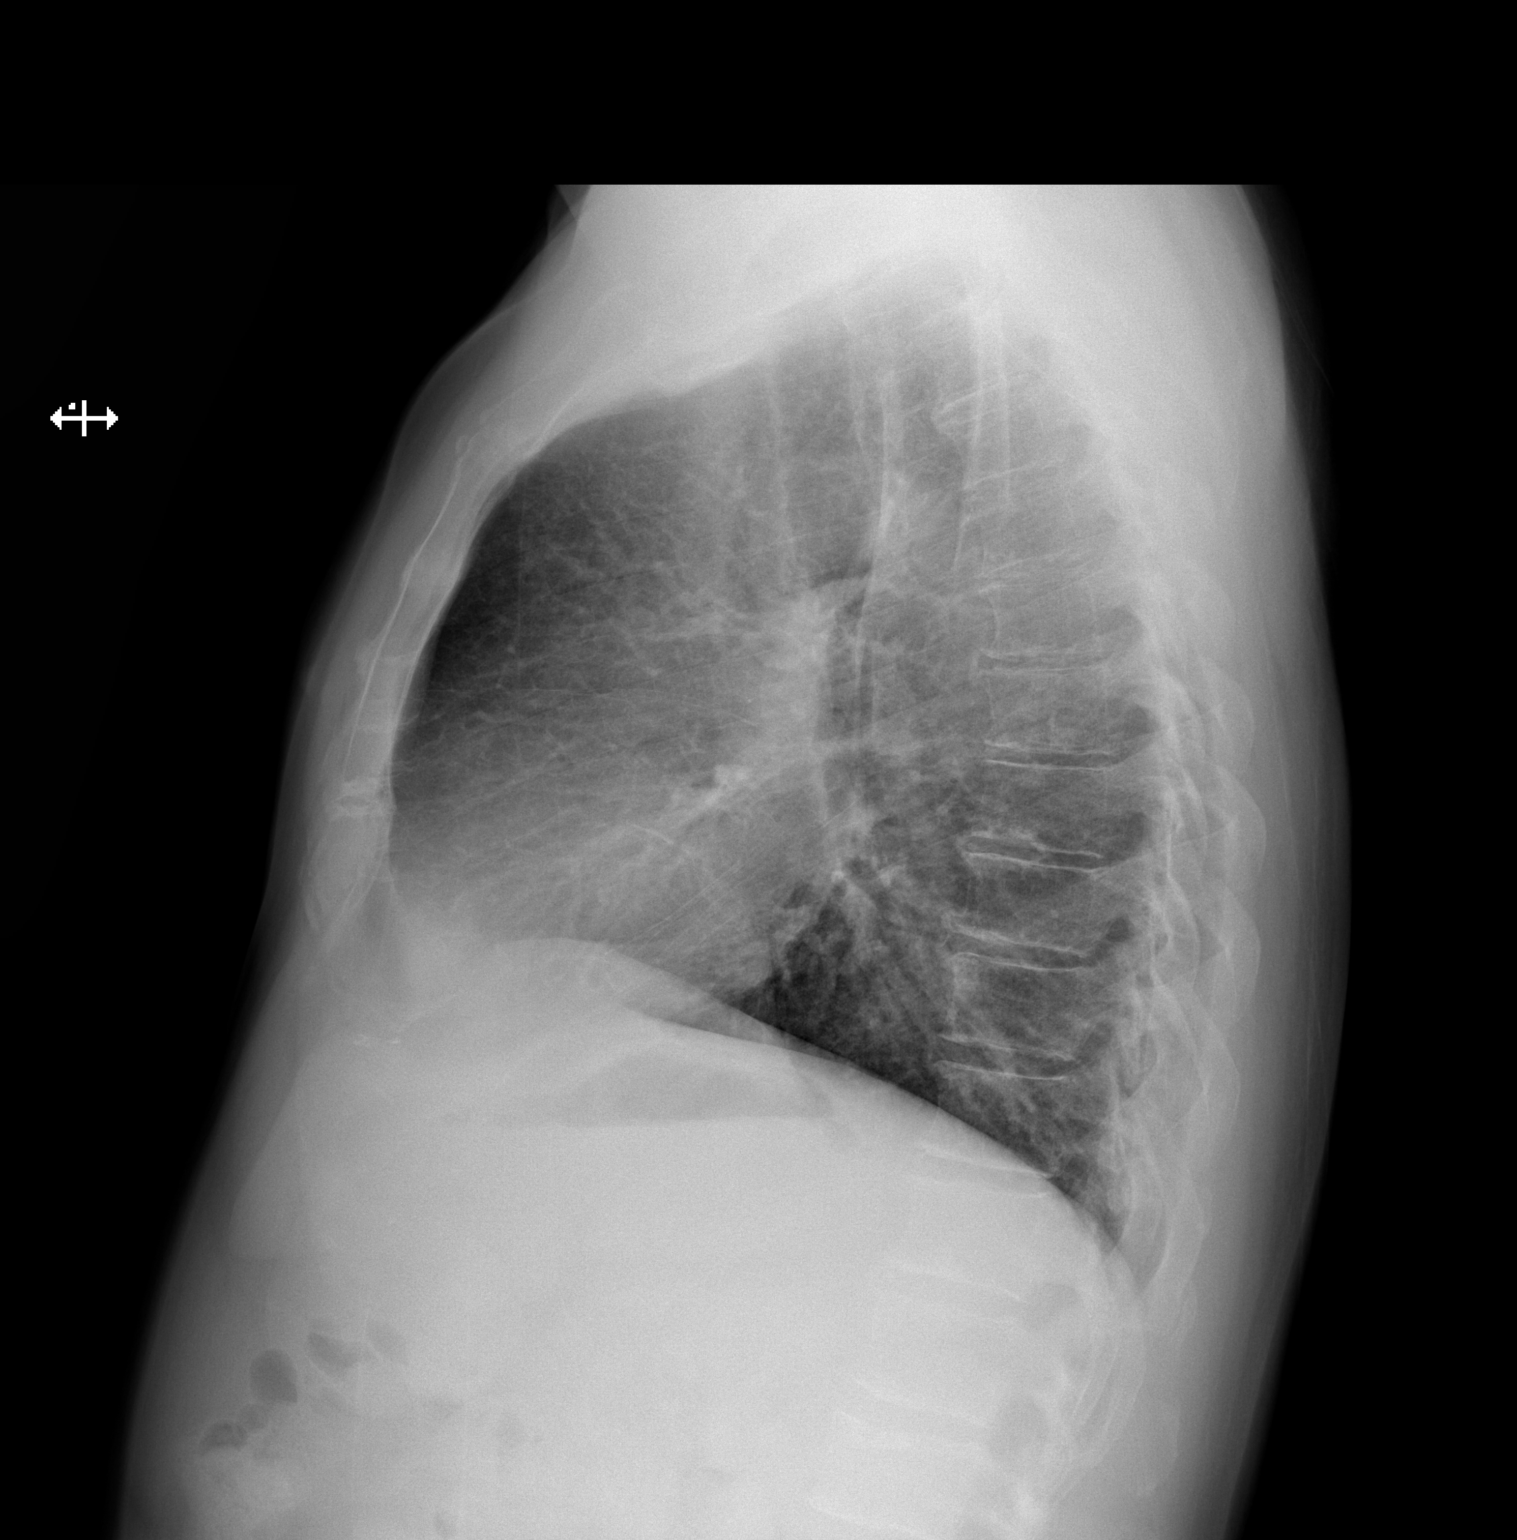

[2 of 2 positions shown; findings below may reference images not displayed]

FINDINGS: Cardiomediastinal silhouette unchanged in size and contour. No
evidence of central vascular congestion. No interlobular septal
thickening.

No pneumothorax or pleural effusion. Coarsened interstitial
markings, with no confluent airspace disease.

No acute displaced fracture. Degenerative changes of the spine.
IMPRESSION: Negative for acute cardiopulmonary disease

## 2023-04-11 IMAGING — RF DG CERVICAL SPINE 2 OR 3 VIEWS
1 series · 3 of 3 positions shown · non-contrast
Comparison: Preoperative MRI 06/08/2021

CLINICAL DATA: Cervical spine surgery.

EXAM:
CERVICAL SPINE - 2-3 VIEW

[Series 1: run · 3 of 3 slices shown]
[im 1/3]
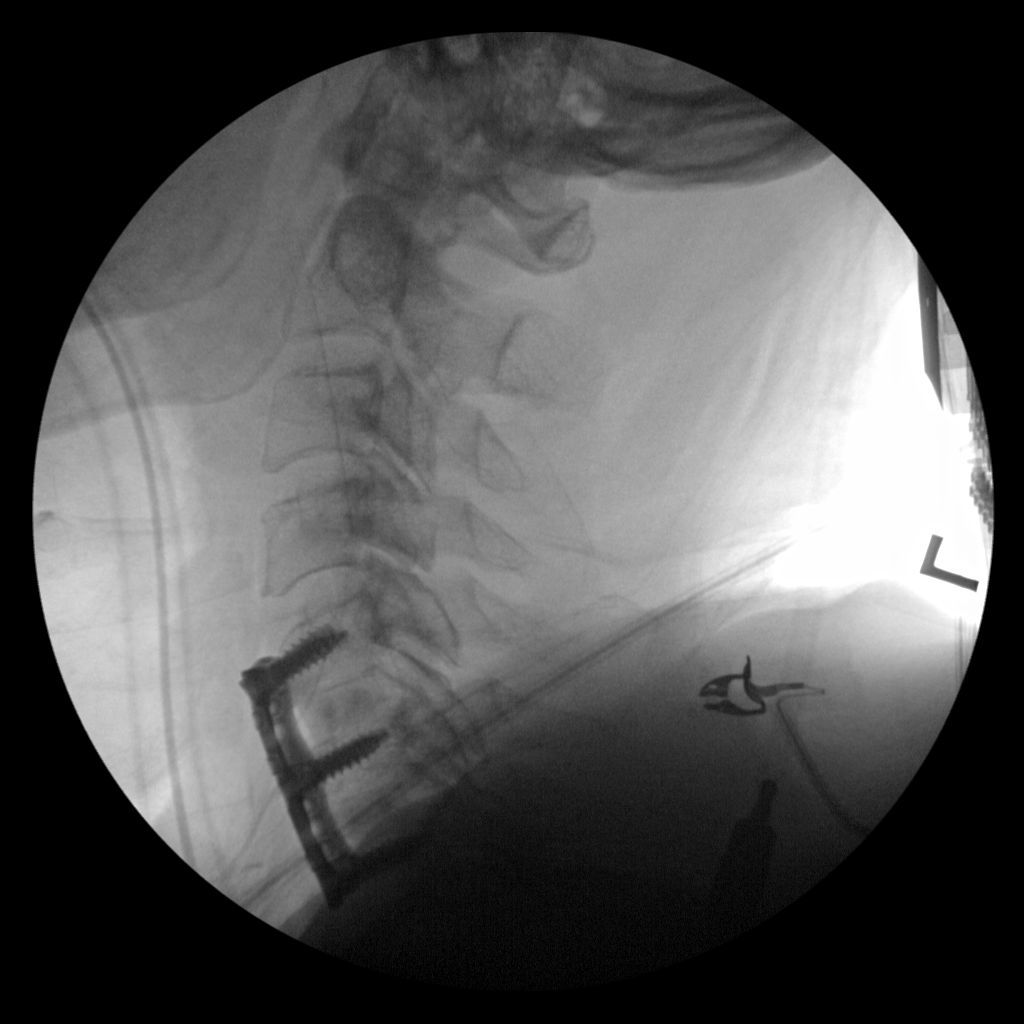
[im 2/3]
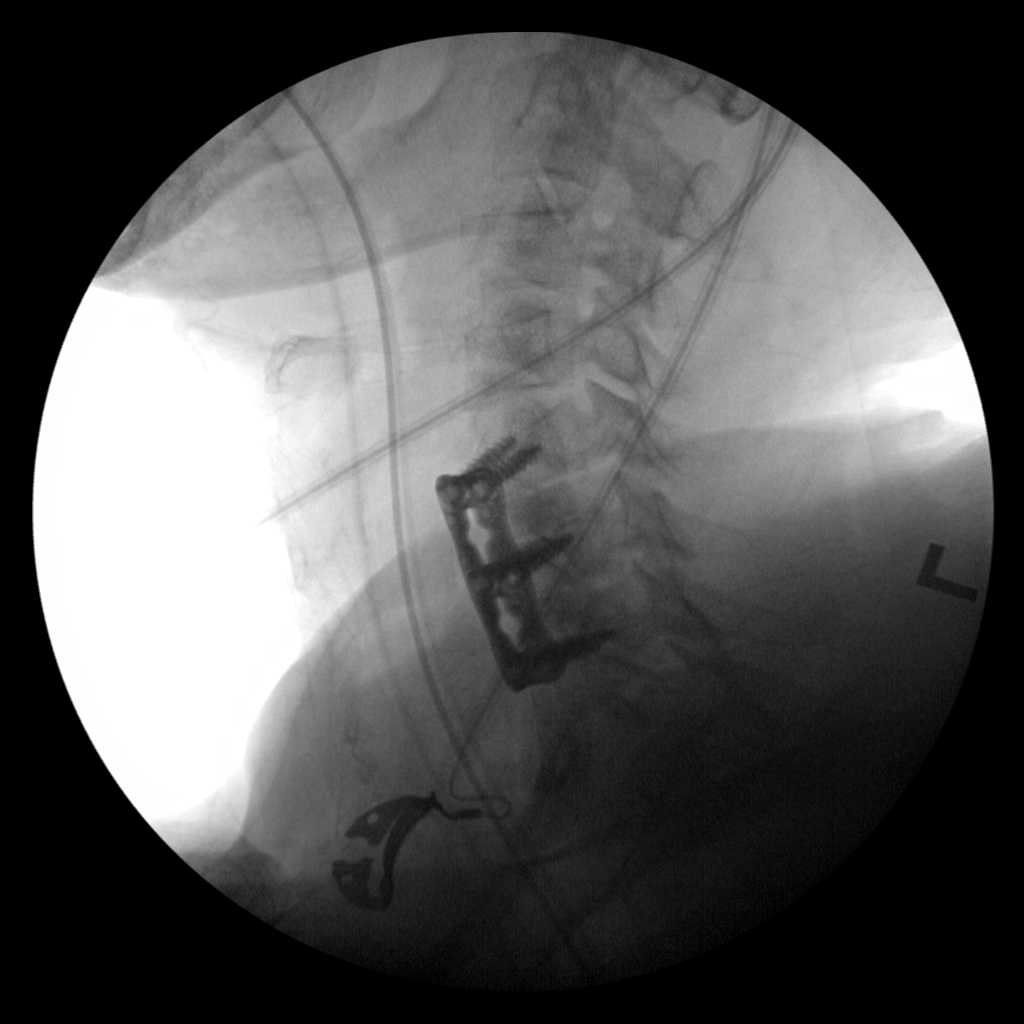
[im 3/3]
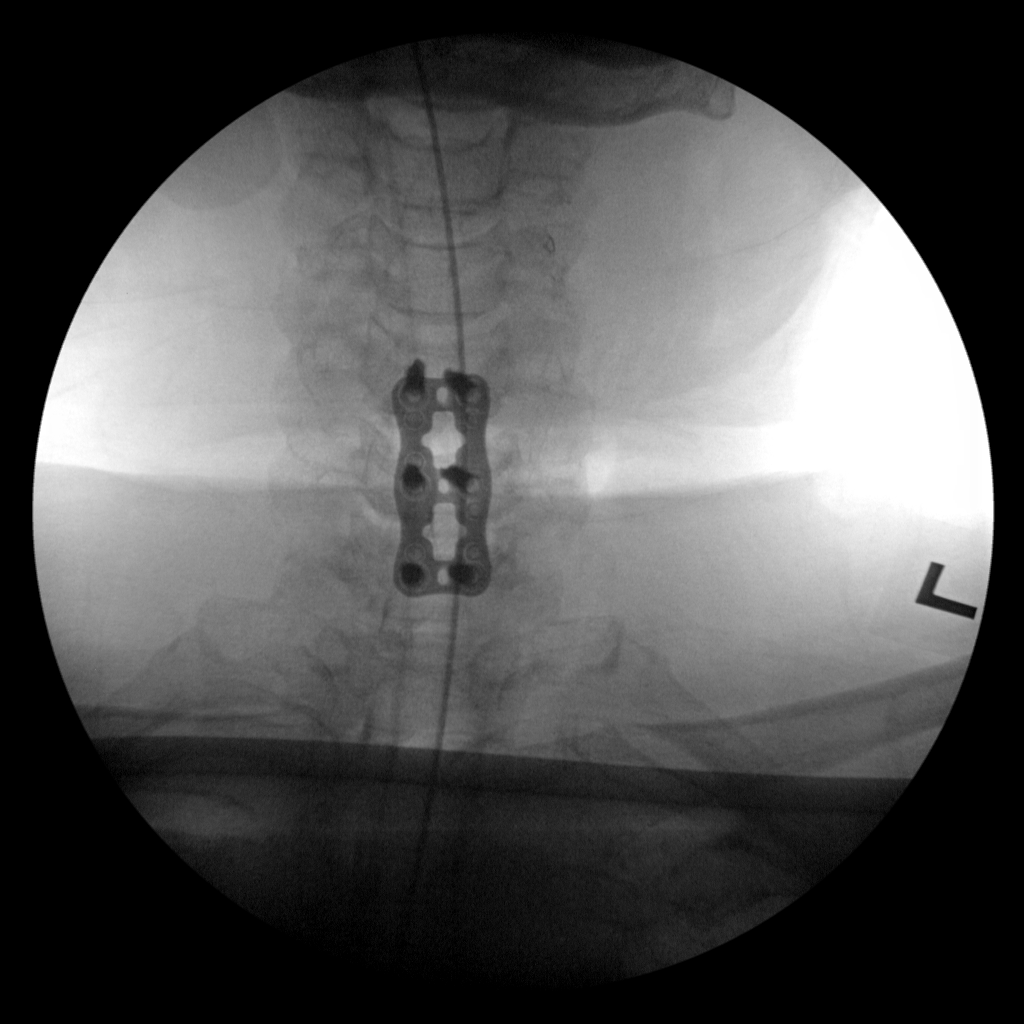

[3 of 3 positions shown; findings below may reference images not displayed]

FINDINGS: Three fluoroscopic spot views of the cervical spine obtained in the
operating room. Anterior C5-C7 fusion. Fluoroscopy time 23 seconds.
Dose 3.07 mGy.
IMPRESSION: Intraoperative fluoroscopy during anterior C5-C7 fusion.

## 2023-06-20 ENCOUNTER — Ambulatory Visit (HOSPITAL_COMMUNITY)
Admission: RE | Admit: 2023-06-20 | Discharge: 2023-06-20 | Disposition: A | Discharge status: Home / Self Care | Payer: Medicaid Other | Source: Ambulatory Visit | Attending: Physician Assistant | Admitting: Physician Assistant

## 2023-06-20 ENCOUNTER — Other Ambulatory Visit (HOSPITAL_COMMUNITY): Payer: Self-pay | Admitting: Physician Assistant

## 2023-06-20 DIAGNOSIS — M542 Cervicalgia: Secondary | ICD-10-CM | POA: Diagnosis present

## 2023-10-30 ENCOUNTER — Other Ambulatory Visit (HOSPITAL_COMMUNITY): Payer: Self-pay | Admitting: Nurse Practitioner

## 2023-10-30 DIAGNOSIS — M25531 Pain in right wrist: Secondary | ICD-10-CM

## 2024-04-18 ENCOUNTER — Other Ambulatory Visit (HOSPITAL_COMMUNITY): Payer: Self-pay | Admitting: Nurse Practitioner

## 2024-04-18 DIAGNOSIS — R52 Pain, unspecified: Secondary | ICD-10-CM

## 2024-04-28 ENCOUNTER — Other Ambulatory Visit (HOSPITAL_COMMUNITY): Payer: Self-pay | Admitting: Nurse Practitioner

## 2024-04-28 DIAGNOSIS — F17200 Nicotine dependence, unspecified, uncomplicated: Secondary | ICD-10-CM

## 2024-04-28 DIAGNOSIS — Z122 Encounter for screening for malignant neoplasm of respiratory organs: Secondary | ICD-10-CM

## 2024-05-20 ENCOUNTER — Ambulatory Visit (HOSPITAL_COMMUNITY)
Admission: RE | Admit: 2024-05-20 | Discharge: 2024-05-20 | Disposition: A | Discharge status: Home / Self Care | Source: Ambulatory Visit | Attending: Nurse Practitioner | Admitting: Nurse Practitioner

## 2024-05-20 DIAGNOSIS — I700 Atherosclerosis of aorta: Secondary | ICD-10-CM | POA: Diagnosis not present

## 2024-05-20 DIAGNOSIS — Z122 Encounter for screening for malignant neoplasm of respiratory organs: Secondary | ICD-10-CM | POA: Insufficient documentation

## 2024-05-20 DIAGNOSIS — F17210 Nicotine dependence, cigarettes, uncomplicated: Secondary | ICD-10-CM | POA: Insufficient documentation

## 2024-05-20 DIAGNOSIS — J439 Emphysema, unspecified: Secondary | ICD-10-CM | POA: Insufficient documentation

## 2024-05-20 DIAGNOSIS — F17200 Nicotine dependence, unspecified, uncomplicated: Secondary | ICD-10-CM | POA: Diagnosis present
